# Patient Record
Sex: Female | Born: 1965 | Race: White | Hispanic: No | Marital: Married | Smoking: Never smoker
Health system: Southern US, Community
[De-identification: ages and names within clinical notes are randomized; demographics above are authoritative.]

## PROBLEM LIST (undated history)

## (undated) DIAGNOSIS — E785 Hyperlipidemia, unspecified: Secondary | ICD-10-CM

## (undated) DIAGNOSIS — F419 Anxiety disorder, unspecified: Secondary | ICD-10-CM

## (undated) DIAGNOSIS — T7840XA Allergy, unspecified, initial encounter: Secondary | ICD-10-CM

## (undated) DIAGNOSIS — B977 Papillomavirus as the cause of diseases classified elsewhere: Secondary | ICD-10-CM

## (undated) HISTORY — PX: TONSILLECTOMY: SUR1361

## (undated) HISTORY — DX: Allergy, unspecified, initial encounter: T78.40XA

## (undated) HISTORY — DX: Hyperlipidemia, unspecified: E78.5

## (undated) HISTORY — DX: Papillomavirus as the cause of diseases classified elsewhere: B97.7

## (undated) HISTORY — DX: Anxiety disorder, unspecified: F41.9

---

## 1985-05-29 HISTORY — PX: WISDOM TOOTH EXTRACTION: SHX21

## 1999-01-25 ENCOUNTER — Other Ambulatory Visit: Admission: RE | Admit: 1999-01-25 | Discharge: 1999-01-25 | Payer: Self-pay | Admitting: Family Medicine

## 2000-07-11 ENCOUNTER — Other Ambulatory Visit: Admission: RE | Admit: 2000-07-11 | Discharge: 2000-07-11 | Payer: Self-pay | Admitting: Family Medicine

## 2001-03-19 ENCOUNTER — Encounter: Admission: RE | Admit: 2001-03-19 | Discharge: 2001-03-19 | Payer: Self-pay | Admitting: Family Medicine

## 2001-03-19 ENCOUNTER — Encounter: Payer: Self-pay | Admitting: Family Medicine

## 2001-09-25 ENCOUNTER — Other Ambulatory Visit: Admission: RE | Admit: 2001-09-25 | Discharge: 2001-09-25 | Payer: Self-pay | Admitting: Family Medicine

## 2002-09-29 ENCOUNTER — Other Ambulatory Visit: Admission: RE | Admit: 2002-09-29 | Discharge: 2002-09-29 | Payer: Self-pay | Admitting: Family Medicine

## 2002-11-24 ENCOUNTER — Other Ambulatory Visit: Admission: RE | Admit: 2002-11-24 | Discharge: 2002-11-24 | Payer: Self-pay | Admitting: Family Medicine

## 2003-04-28 ENCOUNTER — Other Ambulatory Visit: Admission: RE | Admit: 2003-04-28 | Discharge: 2003-04-28 | Payer: Self-pay | Admitting: Obstetrics and Gynecology

## 2003-06-25 ENCOUNTER — Other Ambulatory Visit: Admission: RE | Admit: 2003-06-25 | Discharge: 2003-06-25 | Payer: Self-pay | Admitting: Obstetrics and Gynecology

## 2003-12-02 ENCOUNTER — Other Ambulatory Visit: Admission: RE | Admit: 2003-12-02 | Discharge: 2003-12-02 | Payer: Self-pay | Admitting: Obstetrics and Gynecology

## 2004-01-05 ENCOUNTER — Other Ambulatory Visit: Admission: RE | Admit: 2004-01-05 | Discharge: 2004-01-05 | Payer: Self-pay | Admitting: Obstetrics and Gynecology

## 2004-03-29 ENCOUNTER — Other Ambulatory Visit: Admission: RE | Admit: 2004-03-29 | Discharge: 2004-03-29 | Payer: Self-pay | Admitting: Obstetrics and Gynecology

## 2004-09-15 ENCOUNTER — Other Ambulatory Visit: Admission: RE | Admit: 2004-09-15 | Discharge: 2004-09-15 | Payer: Self-pay | Admitting: Obstetrics and Gynecology

## 2005-03-09 ENCOUNTER — Other Ambulatory Visit: Admission: RE | Admit: 2005-03-09 | Discharge: 2005-03-09 | Payer: Self-pay | Admitting: Obstetrics and Gynecology

## 2006-10-29 ENCOUNTER — Ambulatory Visit: Payer: Self-pay | Admitting: Family Medicine

## 2006-10-29 DIAGNOSIS — E7800 Pure hypercholesterolemia, unspecified: Secondary | ICD-10-CM

## 2006-10-31 LAB — CONVERTED CEMR LAB
Cholesterol: 204 mg/dL (ref 0–200)
Direct LDL: 88.7 mg/dL
HDL: 86.2 mg/dL (ref >39.0)
Total CHOL/HDL Ratio: 2.4
Triglycerides: 115 mg/dL (ref 0–149)
VLDL: 23 mg/dL (ref 0–40)

## 2007-09-13 ENCOUNTER — Ambulatory Visit: Payer: Self-pay | Admitting: Family Medicine

## 2008-05-11 ENCOUNTER — Telehealth: Payer: Self-pay | Admitting: Family Medicine

## 2008-06-26 ENCOUNTER — Ambulatory Visit: Payer: Self-pay | Admitting: Family Medicine

## 2008-06-26 DIAGNOSIS — N390 Urinary tract infection, site not specified: Secondary | ICD-10-CM

## 2008-06-26 LAB — CONVERTED CEMR LAB
Bacteria, UA: 0
Bilirubin Urine: NEGATIVE
Glucose, Urine, Semiquant: NEGATIVE
Ketones, urine, test strip: NEGATIVE
Nitrite: NEGATIVE
Protein, U semiquant: NEGATIVE
Specific Gravity, Urine: <1.005
Urobilinogen, UA: 0.2
pH: 6.5

## 2008-06-27 ENCOUNTER — Encounter (INDEPENDENT_AMBULATORY_CARE_PROVIDER_SITE_OTHER): Payer: Self-pay | Admitting: Internal Medicine

## 2008-10-21 ENCOUNTER — Ambulatory Visit: Payer: Self-pay | Admitting: Family Medicine

## 2008-10-21 DIAGNOSIS — M545 Low back pain, unspecified: Secondary | ICD-10-CM | POA: Insufficient documentation

## 2008-10-22 ENCOUNTER — Encounter: Payer: Self-pay | Admitting: Family Medicine

## 2008-10-22 ENCOUNTER — Ambulatory Visit: Payer: Self-pay | Admitting: Family Medicine

## 2008-10-29 ENCOUNTER — Encounter (INDEPENDENT_AMBULATORY_CARE_PROVIDER_SITE_OTHER): Payer: Self-pay | Admitting: *Deleted

## 2009-05-11 ENCOUNTER — Telehealth: Payer: Self-pay | Admitting: Family Medicine

## 2009-08-23 ENCOUNTER — Telehealth: Payer: Self-pay | Admitting: Family Medicine

## 2010-04-17 IMAGING — CR DG LUMBAR SPINE AP/LAT/OBLIQUES W/ FLEX AND EXT
1 series · 5 of 5 positions shown · non-contrast
Comparison: none

REASON FOR EXAM: Back Pain
COMMENTS:

[Series 1: view not recorded · 0.17mm/px · 5 of 5 slices shown]
[im 1/5]
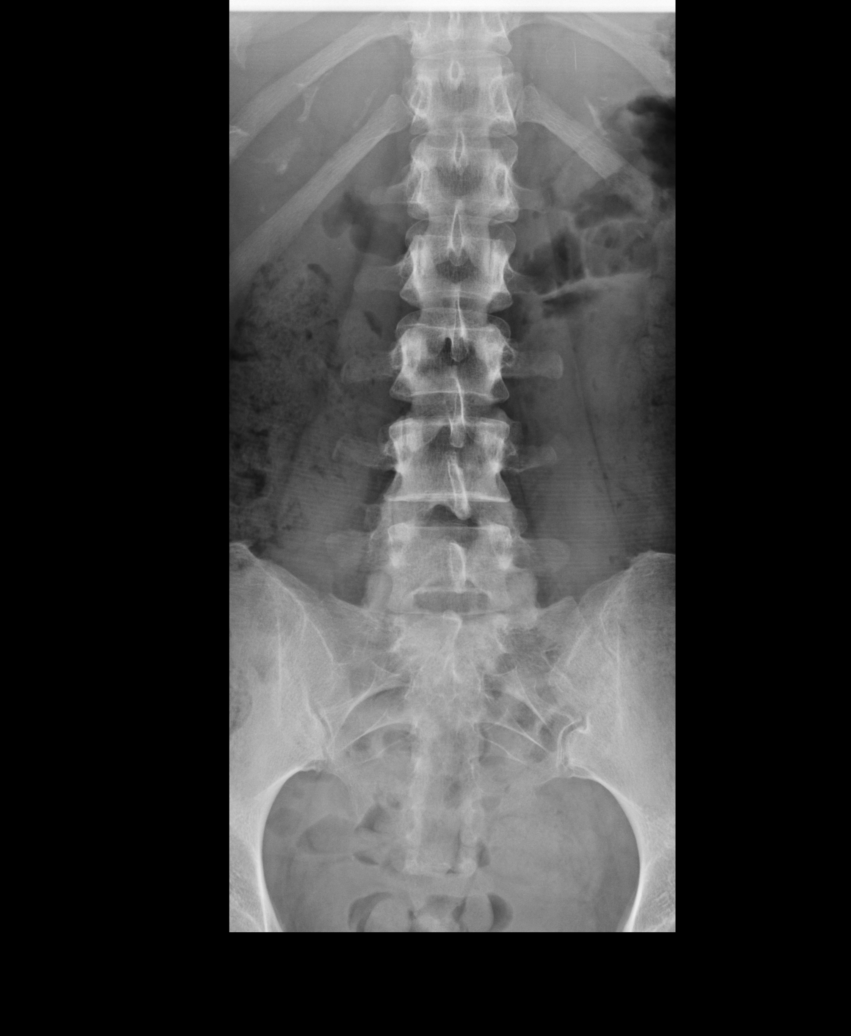
[im 2/5]
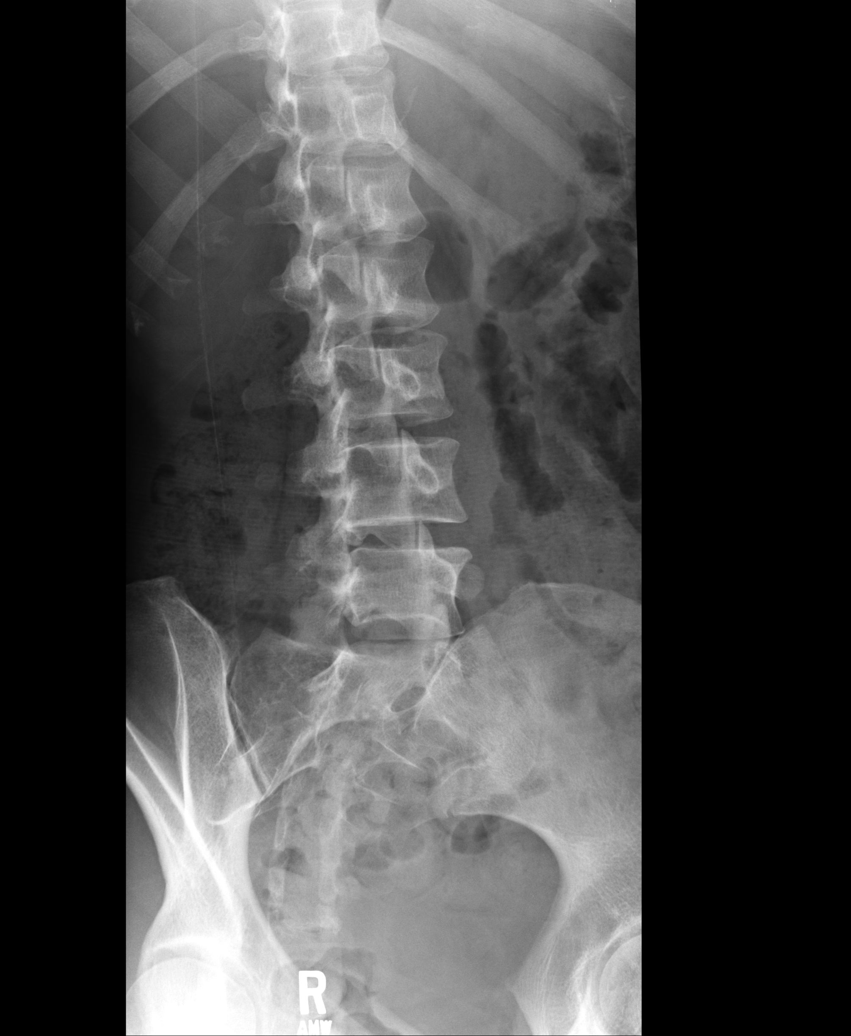
[im 3/5]
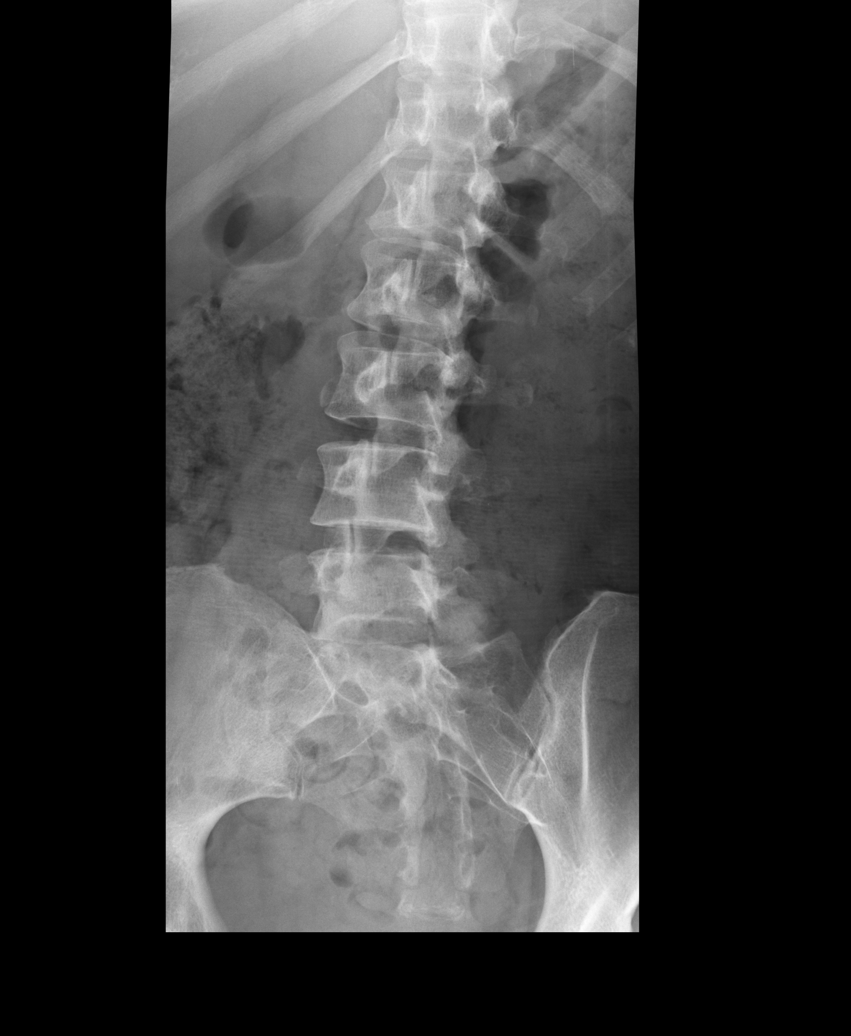
[im 4/5]
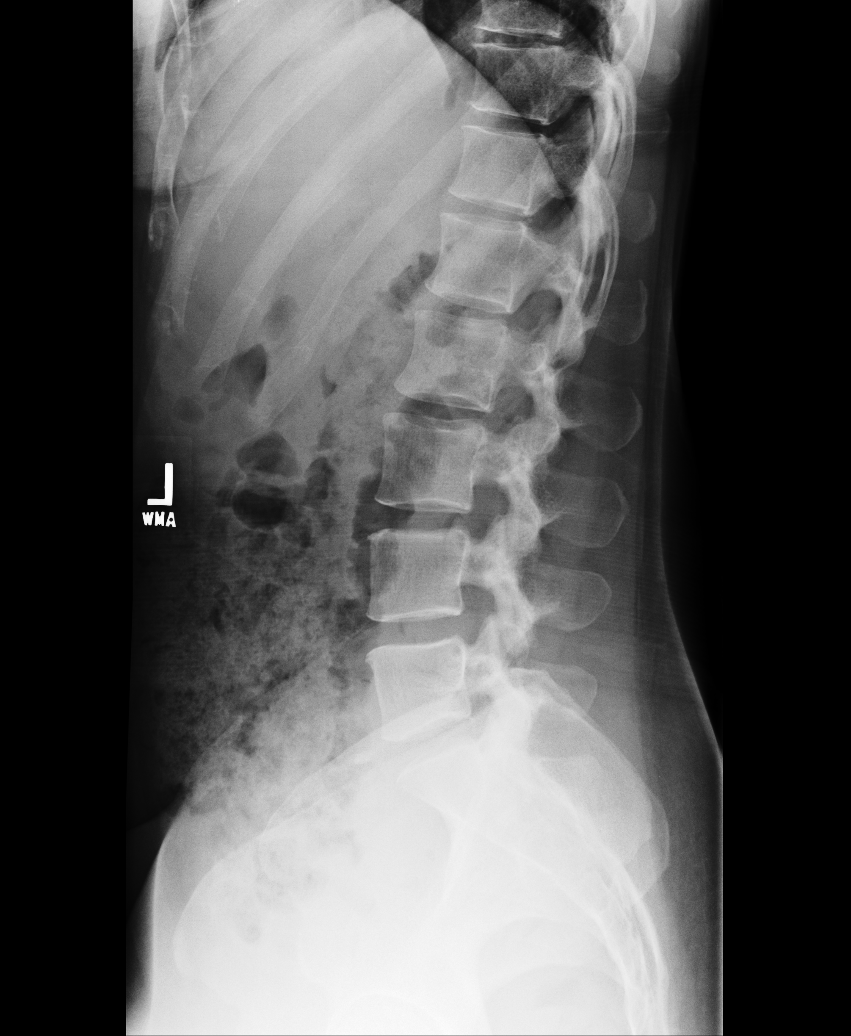
[im 5/5]
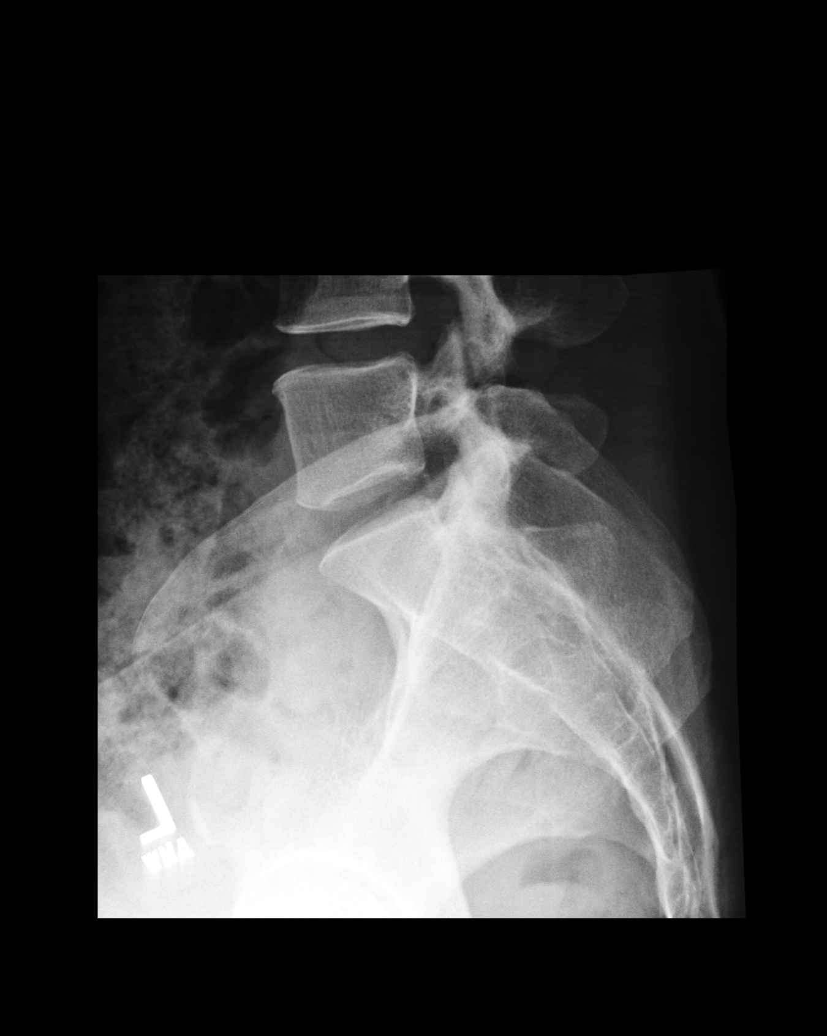

[5 of 5 positions shown; findings below may reference images not displayed]

PROCEDURE:     DXR - DXR LUMBAR SPINE WITH OBLIQUES  - October 22, 2008  [DATE]

RESULT:     The lumbar vertebral bodies are preserved in height. The
pedicles and transverse processes are intact. The observed portions of the
sacrum appear normal. The SI joints exhibit no acute abnormality. I see no
evidence of a pars defect nor of spondylolisthesis. There are calcifications
that project over the collecting systems of both kidneys.
IMPRESSION: I do not see evidence of acute lumbar spine abnormality. I
cannot exclude possible kidneys stones although overlying costal cartilage
calcifications could be responsible for the densities seen on the AP view
projecting over both kidneys. Correlation clinically is needed.

## 2010-05-29 HISTORY — PX: GUM SURGERY: SHX658

## 2010-06-28 NOTE — Progress Notes (Signed)
Summary: Ibuprofen 800mg  refill  Phone Note Refill Request Call back at [phone] Message from:  [facility] [address] on August 23, 2009 10:55 AM  Refills Requested: Medication #1:  IBUPROFEN 800 MG TABS 1 by mouth up to three times a day with food as needed back pain   Last Refilled: 07/01/2003 [facility] [address] request refill on Ibuprofen 800mg . Please advise.   Method Requested: Telephone to Pharmacy Initial call taken by: Lewanda Rife LPN,  August 23, 2009 10:56 AM  Follow-up for Phone Call        px written on EMR for call in  Follow-up by: Judith Part MD,  August 23, 2009 11:15 AM  Additional Follow-up for Phone Call Additional follow up Details #1::        Medication phoned to [facility] [address] pharmacy as instructed. Lewanda Rife LPN  August 23, 2009 2:09 PM     New/Updated Medications: IBUPROFEN 800 MG TABS (IBUPROFEN) 1 by mouth up to three times a day with food as needed back pain Prescriptions: IBUPROFEN 800 MG TABS (IBUPROFEN) 1 by mouth up to three times a day with food as needed back pain  #60 x 5   Entered and Authorized by:   Judith Part MD   Signed by:   Lewanda Rife LPN on 72/53/6644   Method used:   Telephoned to ...       [facility]  [address] (retail)       [address]       [place], [place]  [address]       Ph: [phone] or [phone]       Fax: [phone]   RxID:   [id number]

## 2010-10-25 ENCOUNTER — Encounter: Payer: Self-pay | Admitting: Family Medicine

## 2010-10-25 ENCOUNTER — Telehealth: Payer: Self-pay | Admitting: *Deleted

## 2010-10-25 NOTE — Telephone Encounter (Signed)
Pt called this morning complaining of abd pain since Sunday, it's worse today.  She says it hurts to walk. Per Dr. Milinda Antis advised her to go to ER or [facility], since she will probably needs some type of scan.  Pt agreed.

## 2010-10-26 ENCOUNTER — Ambulatory Visit (INDEPENDENT_AMBULATORY_CARE_PROVIDER_SITE_OTHER): Payer: BC Managed Care – PPO | Admitting: Internal Medicine

## 2010-10-26 ENCOUNTER — Encounter: Payer: Self-pay | Admitting: Internal Medicine

## 2010-10-26 ENCOUNTER — Telehealth: Payer: Self-pay | Admitting: *Deleted

## 2010-10-26 ENCOUNTER — Encounter: Payer: Self-pay | Admitting: *Deleted

## 2010-10-26 VITALS — BP 108/60 | HR 82 | Temp 98.5°F | Ht 66.0 in | Wt 130.0 lb

## 2010-10-26 DIAGNOSIS — R109 Unspecified abdominal pain: Secondary | ICD-10-CM

## 2010-10-26 DIAGNOSIS — R1013 Epigastric pain: Secondary | ICD-10-CM

## 2010-10-26 LAB — CBC WITH DIFFERENTIAL/PLATELET
Basophils Absolute: 0 10*3/uL (ref 0.0–0.1)
Eosinophils Absolute: 0 10*3/uL (ref 0.0–0.7)
HCT: 43 % (ref 36.0–46.0)
Hemoglobin: 14.5 g/dL (ref 12.0–15.0)
Lymphs Abs: 2.1 10*3/uL (ref 0.7–4.0)
MCHC: 33.8 g/dL (ref 30.0–36.0)
MCV: 89.9 fl (ref 78.0–100.0)
Neutro Abs: 9.4 10*3/uL — ABNORMAL HIGH (ref 1.4–7.7)
RDW: 13.7 % (ref 11.5–14.6)

## 2010-10-26 LAB — BASIC METABOLIC PANEL
CO2: 27 mEq/L (ref 19–32)
Calcium: 9.8 mg/dL (ref 8.4–10.5)
GFR: 72.9 mL/min (ref >60.00)
Sodium: 139 mEq/L (ref 135–145)

## 2010-10-26 LAB — POCT URINALYSIS DIPSTICK
Glucose, UA: NEGATIVE
Nitrite, UA: NEGATIVE
Spec Grav, UA: 1
Urobilinogen, UA: 0.2

## 2010-10-26 LAB — SEDIMENTATION RATE: Sed Rate: 37 mm/hr — ABNORMAL HIGH (ref 0–22)

## 2010-10-26 LAB — HEPATIC FUNCTION PANEL
Alkaline Phosphatase: 68 U/L (ref 39–117)
Bilirubin, Direct: 0.2 mg/dL (ref 0.0–0.3)
Total Bilirubin: 0.6 mg/dL (ref 0.3–1.2)

## 2010-10-26 NOTE — Telephone Encounter (Signed)
Pt called with name of medicine that she couldn't remember this morning- it's diethylpropion ER 75 mg's, diet pill- otherwise known as tenuate.

## 2010-10-26 NOTE — Progress Notes (Signed)
Subjective:    Patient ID: Kaitlin Fuller, female    DOB: 03/27/1966, 45 y.o.   MRN: [id number]  HPI 3 nights ago, had very hot chili Stomach felt like it was burning all that night The next day had hot/cold sensation in stomach Appetite was off--since then  Yesterday, awoke with terrible abdominal pain Worse with any movement Had tender abdomen with guarding per physician husband (who is here)  Has tried zantac bid since this started Seems to "take some of the edge off"  No dysuria, frequency or urgency No sig back pain  Lots of stress  Now chair of business at [employer] Has upcoming gum surgery--saw video of this and was grossed out  Has been on a diet med for 5 months Not sure of name  Current outpatient prescriptions:fish oil-omega-3 fatty acids 1000 MG capsule, Take 1 g by mouth 3 (three) times daily.  , Disp: , Rfl: ;  ibuprofen (ADVIL,MOTRIN) 800 MG tablet, Take 800 mg by mouth 3 (three) times daily as needed.  , Disp: , Rfl: ;  Multiple Vitamins-Minerals (COMPLETE WOMENS PO), Take 1 tablet by mouth daily.  , Disp: , Rfl: ;  ranitidine (ZANTAC) 150 MG capsule, Take 150 mg by mouth 2 (two) times daily.  , Disp: , Rfl:  DISCONTD: aspirin 81 MG tablet, Take 81 mg by mouth daily.  , Disp: , Rfl: ;  DISCONTD: cyclobenzaprine (FLEXERIL) 10 MG tablet, Take 5-10 mg by mouth 3 (three) times daily as needed.  , Disp: , Rfl: ;  DISCONTD: drospirenone-ethinyl estradiol (YAZ) 3-0.02 MG per tablet, Take 1 tablet by mouth daily.  , Disp: , Rfl: ;  DISCONTD: scopolamine (TRANSDERM-SCOP) 1.5 MG, Place 1 patch onto the skin every 3 (three) days.  , Disp: , Rfl:   No past medical history on file.  No past surgical history on file.  No family history on file.  History   Social History  . Marital Status: Married    Spouse Name: N/A    Number of Children: N/A  . Years of Education: N/A   Occupational History  . Not on file.   Social History Main Topics  . Smoking status: Never Smoker   .  Smokeless tobacco: Not on file  . Alcohol Use:   . Drug Use:   . Sexually Active:    Other Topics Concern  . Not on file   Social History Narrative   Husband is a Administrator, arts, 2nd marriageExercises regular including yoga    Review of Systems No history of gastritis or ulcer Rarely drinks alcohol--glass of wine last a week ago Uses ibuprofen 800mg  with period--finished last week No dyspareunia    Objective:   Physical Exam  Constitutional: She appears well-developed and well-nourished. No distress.  Neck: Normal range of motion. Neck supple. No thyromegaly present.  Cardiovascular: Normal rate, regular rhythm, normal heart sounds and intact distal pulses.  Exam reveals no gallop.   No murmur heard. Pulmonary/Chest: Effort normal and breath sounds normal. No respiratory distress. She exhibits no tenderness.  Abdominal: Soft. Bowel sounds are normal. She exhibits no mass. There is tenderness. There is no rebound and no guarding.       Epigastric and left side tenderness No RUQ tenderness Murphy's sign absent  Musculoskeletal:       No CVA tenderness  Lymphadenopathy:    She has no cervical adenopathy.  Psychiatric: She has a normal mood and affect. Her behavior is normal. Judgment and thought content normal.  Assessment & Plan:

## 2010-10-26 NOTE — Telephone Encounter (Signed)
No listing of pancreatitis with tenuate but can cause abdominal pain Will await labs

## 2010-10-26 NOTE — Telephone Encounter (Signed)
Message copied by Sueanne Margarita on Wed Oct 26, 2010  5:49 PM ------      Message from: Tillman Abide I      Created: Wed Oct 26, 2010  4:03 PM       Please call      Blood work was not very revealing      White blood count is slightly elevated, as is the sed rate---a non specific indicator of inflammation      The pancreas tests and Helicobactor pylori test (the ulcer bacteria) were both normal      No anemia on blood count      Blood sugar, liver and kidney were all normal            Please continue the zantac. If the pain doesn't get better, the next step would be to check an ultrasound

## 2010-11-22 ENCOUNTER — Other Ambulatory Visit: Payer: Self-pay | Admitting: Family Medicine

## 2010-11-23 NOTE — Telephone Encounter (Signed)
Okay to refill #100 x 0 Please make sure she knows that this can cause stomach pain She should only take it on a full stomach, and stop it if she notes abd pain after taking

## 2010-11-23 NOTE — Telephone Encounter (Signed)
rx sent to pharmacy by e-script  

## 2011-04-05 ENCOUNTER — Encounter: Payer: Self-pay | Admitting: Family Medicine

## 2011-04-05 ENCOUNTER — Ambulatory Visit (INDEPENDENT_AMBULATORY_CARE_PROVIDER_SITE_OTHER): Payer: BC Managed Care – PPO | Admitting: Family Medicine

## 2011-04-05 VITALS — BP 110/62 | HR 69 | Temp 98.8°F | Wt 134.5 lb

## 2011-04-05 DIAGNOSIS — R21 Rash and other nonspecific skin eruption: Secondary | ICD-10-CM | POA: Insufficient documentation

## 2011-04-05 MED ORDER — METRONIDAZOLE 0.75 % EX GEL: Freq: Two times a day (BID) | CUTANEOUS | Status: DC

## 2011-04-05 MED ORDER — TRIAMCINOLONE ACETONIDE 0.025 % EX OINT: TOPICAL_OINTMENT | Freq: Two times a day (BID) | CUTANEOUS | Status: DC

## 2011-04-05 NOTE — Progress Notes (Addendum)
  Subjective:    Patient ID: Kaitlin Fuller, female    DOB: January 14, 1966, 45 y.o.   MRN: [id number]  HPI  45 yo here for right eye lid lesion.  Noticed it over 4 weeks ago- first was itchy and irritated. Then pustular lesions developed and drained thick fluid. Since then, pustules have resolved but still irritated and itchy. Abx ointment not helping. No conjunctival injection, blurred vision, or photophobia.  Never had anything like this before.  No h/o eczema or seasonal allergies.  Patient Active Problem List  Diagnoses  . HYPERCHOLESTEROLEMIA, PURE  . UTI  . BACK PAIN, LUMBAR   No past medical history on file. No past surgical history on file. History  Substance Use Topics  . Smoking status: Never Smoker   . Smokeless tobacco: Not on file  . Alcohol Use:    No family history on file. No Known Allergies Current Outpatient Prescriptions on File Prior to Visit  Medication Sig Dispense Refill  . ibuprofen (ADVIL,MOTRIN) 800 MG tablet TAKE 1 TABLET BY MOUTH UP TO 3 TIMES A DAY WITH FOOD AS NEEDED FOR BACKPAIN  100 tablet  0  . Multiple Vitamins-Minerals (COMPLETE WOMENS PO) Take 1 tablet by mouth daily.        . ranitidine (ZANTAC) 150 MG capsule Take 150 mg by mouth 2 (two) times daily.        . fish oil-omega-3 fatty acids 1000 MG capsule Take 1 g by mouth 3 (three) times daily.         The PMH, PSH, Social History, Family History, Medications, and allergies have been reviewed in [facility], and have been updated if relevant.   Review of Systems    See HPI Objective:   Physical Exam BP 110/62  Pulse 69  Temp(Src) 98.8 F (37.1 C) (Oral)  Wt 134 lb 8 oz (61.009 kg)  LMP 03/29/2011  General:  Well-developed,well-nourished,in no acute distress; alert,appropriate and cooperative throughout examination Head:  normocephalic and atraumatic.   Eyes:  vision grossly intact, pupils equal, pupils round, and pupils reactive to light.   Right eye lid erythematous, flakly, no drainage  or pustules. Ears:  R ear normal and L ear normal.   Nose:  no external deformity.   Mouth:  good dentition.    Neurologic:  alert & oriented X3 and gait normal.   Skin:  Intact without suspicious lesions or rashesy Psych:  Cognition and judgment appear intact. Alert and cooperative with normal attention span and concentration. No apparent delusions, illusions, hallucinations     Assessment & Plan:   1. Rash and nonspecific skin eruption    New. Appears like eczema although not classic presentation. ? perio oral dermatitis which can be worsened by topical steroids. Will try topical metronidazole first for 1-2 weeks. If no improvement, will try triamcinilone.  If no improvement with either, refer to optho or derm. The patient indicates understanding of these issues and agrees with the plan.

## 2011-04-05 NOTE — Progress Notes (Signed)
Addended by: Dianne Dun on: 04/05/2011 10:44 AM   Modules accepted: Orders

## 2011-04-05 NOTE — Patient Instructions (Signed)
Please use steroid ointiment for no longer than 2 weeks. Be very careful not to get it in the eye itself. If no improvement, please let me know.

## 2011-12-04 ENCOUNTER — Encounter: Payer: Self-pay | Admitting: Family Medicine

## 2011-12-04 ENCOUNTER — Ambulatory Visit (INDEPENDENT_AMBULATORY_CARE_PROVIDER_SITE_OTHER): Payer: BC Managed Care – PPO | Admitting: Family Medicine

## 2011-12-04 ENCOUNTER — Telehealth: Payer: Self-pay

## 2011-12-04 VITALS — BP 116/68 | HR 70 | Temp 98.5°F | Ht 64.0 in | Wt 145.8 lb

## 2011-12-04 DIAGNOSIS — N390 Urinary tract infection, site not specified: Secondary | ICD-10-CM

## 2011-12-04 LAB — POCT URINALYSIS DIPSTICK
Blood, UA: NEGATIVE
Glucose, UA: NEGATIVE
Ketones, UA: NEGATIVE
Spec Grav, UA: <=1.005

## 2011-12-04 MED ORDER — CIPROFLOXACIN HCL 250 MG PO TABS: 250.0000 mg | ORAL_TABLET | Freq: Two times a day (BID) | ORAL | Status: AC

## 2011-12-04 NOTE — Progress Notes (Signed)
Subjective:    Patient ID: Kaitlin Fuller, female    DOB: [birth year], 46 y.o.   MRN: [id number]  HPI uti symptoms started after 5 pm on Friday  Had some left over cipro and pyruidium  Took 1 every 12 hours since fri nt -- 6 doses   Feels much better than she was before  Still not 100% When she has to urinate -- still just a little discomfort / also some bladder pressure  Frequency is better  Orange urine - hard to tell if blood  No new back pain (hurt her back before this)  No fever or n/v   Patient Active Problem List  Diagnosis  . HYPERCHOLESTEROLEMIA, PURE  . UTI  . BACK PAIN, LUMBAR  . Rash and nonspecific skin eruption   No past medical history on file. No past surgical history on file. History  Substance Use Topics  . Smoking status: Never Smoker   . Smokeless tobacco: Not on file  . Alcohol Use: Yes     socially   No family history on file. No Known Allergies Current Outpatient Prescriptions on File Prior to Visit  Medication Sig Dispense Refill  . Diethylpropion HCl CR 75 MG TB24 Take 75 mg by mouth daily.      . fish oil-omega-3 fatty acids 1000 MG capsule Take 1 g by mouth 3 (three) times daily.        Marland Kitchen ibuprofen (ADVIL,MOTRIN) 800 MG tablet TAKE 1 TABLET BY MOUTH UP TO 3 TIMES A DAY WITH FOOD AS NEEDED FOR BACKPAIN  100 tablet  0  . Multiple Vitamins-Minerals (COMPLETE WOMENS PO) Take 1 tablet by mouth daily.        . metroNIDAZOLE (METROGEL) 0.75 % gel Apply topically 2 (two) times daily.  45 g  0  . ranitidine (ZANTAC) 150 MG capsule Take 150 mg by mouth 2 (two) times daily.        Marland Kitchen triamcinolone (KENALOG) 0.025 % ointment Apply topically 2 (two) times daily.  30 g  0        Review of Systems Review of Systems  Constitutional: Negative for fever, appetite change, fatigue and unexpected weight change.  Eyes: Negative for pain and visual disturbance.  Respiratory: Negative for cough and shortness of breath.   Cardiovascular: Negative for cp or  palpitations    Gastrointestinal: Negative for nausea, diarrhea and constipation.  Genitourinary: Negative for frequency/ pos for mild urgency and burning  Neg for hematuria or fever  Skin: Negative for pallor or rash   Neurological: Negative for weakness, light-headedness, numbness and headaches.  Hematological: Negative for adenopathy. Does not bruise/bleed easily.  Psychiatric/Behavioral: Negative for dysphoric mood. The patient is not nervous/anxious.         Objective:   Physical Exam  Constitutional: She appears well-developed and well-nourished. No distress.  HENT:  Head: Normocephalic.  Mouth/Throat: Oropharynx is clear and moist.  Eyes: Conjunctivae and EOM are normal. Pupils are equal, round, and reactive to light. No scleral icterus.  Neck: Normal range of motion. Neck supple. No thyromegaly present.  Cardiovascular: Normal rate and regular rhythm.   Pulmonary/Chest: Effort normal and breath sounds normal.  Abdominal: Soft. Bowel sounds are normal. She exhibits no distension and no mass. There is tenderness. There is no rebound and no guarding.       Very slt suprapubic tenderness  Musculoskeletal: She exhibits no edema.       No cva tenderness   Lymphadenopathy:    She has  no cervical adenopathy.  Neurological: She is alert. She has normal reflexes.  Skin: Skin is warm and dry. No rash noted. No pallor.  Psychiatric: She has a normal mood and affect.          Assessment & Plan:

## 2011-12-04 NOTE — Assessment & Plan Note (Signed)
Uncomplicated- partially tx with cipro Will px another 3 d and update if not imp  Still some nitrates on dip but micro is clear

## 2011-12-04 NOTE — Telephone Encounter (Signed)
Frequent and burning urination started 12/01/11. Pt scheduled appt with Dr Milinda Antis today at 3:30pm(pts request). Pt will call back if condition changes or worsens.

## 2011-12-04 NOTE — Patient Instructions (Addendum)
Stop the pyridium and if symptoms worsen- call Drink lot of water  Avoid sodas and artificial sweetner for now  Update if not starting to improve in a week or if worsening   Take the cipro 250 mg twice daily for another 3 days

## 2011-12-05 LAB — POCT UA - MICROSCOPIC ONLY: WBC, Ur, HPF, POC: 0

## 2012-02-16 ENCOUNTER — Other Ambulatory Visit: Payer: Self-pay | Admitting: *Deleted

## 2012-02-16 MED ORDER — IBUPROFEN 800 MG PO TABS
800.0000 mg | ORAL_TABLET | Freq: Three times a day (TID) | ORAL | Status: DC | PRN
Start: 2012-02-16 — End: 2015-03-11

## 2012-02-16 NOTE — Telephone Encounter (Signed)
Will refill electronically  

## 2012-04-02 ENCOUNTER — Encounter: Payer: Self-pay | Admitting: Family Medicine

## 2012-04-02 ENCOUNTER — Ambulatory Visit (INDEPENDENT_AMBULATORY_CARE_PROVIDER_SITE_OTHER): Payer: BC Managed Care – PPO | Admitting: Family Medicine

## 2012-04-02 VITALS — BP 116/68 | HR 60 | Temp 98.4°F | Ht 64.0 in | Wt 137.2 lb

## 2012-04-02 DIAGNOSIS — N390 Urinary tract infection, site not specified: Secondary | ICD-10-CM

## 2012-04-02 DIAGNOSIS — R300 Dysuria: Secondary | ICD-10-CM

## 2012-04-02 LAB — POCT URINALYSIS DIPSTICK
Bilirubin, UA: NEGATIVE
Nitrite, UA: POSITIVE
pH, UA: 7

## 2012-04-02 MED ORDER — CIPROFLOXACIN HCL 250 MG PO TABS: 250.0000 mg | ORAL_TABLET | Freq: Two times a day (BID) | ORAL | Status: DC

## 2012-04-02 MED ORDER — PHENAZOPYRIDINE HCL 200 MG PO TABS: 200.0000 mg | ORAL_TABLET | Freq: Three times a day (TID) | ORAL | Status: DC | PRN

## 2012-04-02 NOTE — Patient Instructions (Addendum)
Drink lots of water  Take pyridium as needed with food  Take cipro as directed  We will let you know when urine culture returns

## 2012-04-02 NOTE — Progress Notes (Signed)
Subjective:    Patient ID: Kaitlin Fuller, female    DOB: November 29, 1965, 46 y.o.   MRN: [id number]  HPI Here for suspected uti   Saturday after noon felt hot and cold Uncomfortable to urinate - felt like she could not empty her bladder totally  A little vaginal discharge - used 3 day monistat Was better Sunday  Monday- worse again- cramping in bladder / some back pain/ felt incomplete emptying  Azo helped symptoms a lot  This feels a little different then a usual bladder infection  She does get the shivers when she urinates  Saturday- urgency and frequency -- better with azo   Temp is normal right now   Patient Active Problem List  Diagnosis  . HYPERCHOLESTEROLEMIA, PURE  . UTI  . BACK PAIN, LUMBAR  . Rash and nonspecific skin eruption   No past medical history on file. No past surgical history on file. History  Substance Use Topics  . Smoking status: Never Smoker   . Smokeless tobacco: Not on file  . Alcohol Use: Yes     Comment: socially   No family history on file. No Known Allergies Current Outpatient Prescriptions on File Prior to Visit  Medication Sig Dispense Refill  . Diethylpropion HCl CR 75 MG TB24 Take 75 mg by mouth daily.      . fish oil-omega-3 fatty acids 1000 MG capsule Take 1 g by mouth 3 (three) times daily.        Marland Kitchen ibuprofen (ADVIL,MOTRIN) 800 MG tablet Take 1 tablet (800 mg total) by mouth 3 (three) times daily as needed for pain (with food).  90 tablet  0  . Multiple Vitamins-Minerals (COMPLETE WOMENS PO) Take 1 tablet by mouth daily.        . phenazopyridine (PYRIDIUM) 200 MG tablet Take 200 mg by mouth 3 (three) times daily as needed.           Review of Systems Review of Systems  Constitutional: Negative for  appetite change,  and unexpected weight change.  Eyes: Negative for pain and visual disturbance.  Respiratory: Negative for cough and shortness of breath.   Cardiovascular: Negative for cp or palpitations    Gastrointestinal: Negative  for nausea, diarrhea and constipation.  Genitourinary: pos for urgency and frequency.neg for blood in urine   Skin: Negative for pallor or rash   Neurological: Negative for weakness, light-headedness, numbness and headaches.  Hematological: Negative for adenopathy. Does not bruise/bleed easily.  Psychiatric/Behavioral: Negative for dysphoric mood. The patient is not nervous/anxious.         Objective:   Physical Exam  Constitutional: She appears well-developed and well-nourished. No distress.  HENT:  Head: Normocephalic and atraumatic.  Eyes: Conjunctivae normal and EOM are normal. Pupils are equal, round, and reactive to light.  Neck: Normal range of motion. Neck supple. No JVD present. No thyromegaly present.  Cardiovascular: Normal rate and regular rhythm.   Pulmonary/Chest: Effort normal and breath sounds normal.  Abdominal: Soft. Bowel sounds are normal. She exhibits no distension and no mass. There is tenderness. There is no rebound and no guarding.       Mild suprapubic tenderness  Musculoskeletal:       No cva tenderness   Lymphadenopathy:    She has no cervical adenopathy.  Neurological: She is alert. She has normal reflexes.  Skin: Skin is warm and dry. No rash noted.  Psychiatric: She has a normal mood and affect.  Assessment & Plan:

## 2012-04-02 NOTE — Assessment & Plan Note (Signed)
Uncomplicated  tx with cipro and pyridium for pain prn  Push fluids Urine sent for cx Adv to update if not improving

## 2012-04-04 LAB — POCT UA - MICROSCOPIC ONLY

## 2012-04-04 LAB — URINE CULTURE

## 2012-05-16 ENCOUNTER — Other Ambulatory Visit: Payer: Self-pay | Admitting: Obstetrics and Gynecology

## 2013-06-25 ENCOUNTER — Encounter: Payer: Self-pay | Admitting: Family Medicine

## 2013-06-25 ENCOUNTER — Ambulatory Visit (INDEPENDENT_AMBULATORY_CARE_PROVIDER_SITE_OTHER)
Admission: RE | Admit: 2013-06-25 | Discharge: 2013-06-25 | Disposition: A | Discharge status: Home / Self Care | Payer: BC Managed Care – PPO | Source: Ambulatory Visit | Attending: Family Medicine | Admitting: Family Medicine

## 2013-06-25 ENCOUNTER — Ambulatory Visit (INDEPENDENT_AMBULATORY_CARE_PROVIDER_SITE_OTHER): Payer: BC Managed Care – PPO | Admitting: Family Medicine

## 2013-06-25 VITALS — BP 100/70 | HR 67 | Temp 98.4°F | Ht 63.5 in | Wt 149.5 lb

## 2013-06-25 DIAGNOSIS — M79609 Pain in unspecified limb: Secondary | ICD-10-CM

## 2013-06-25 DIAGNOSIS — M79671 Pain in right foot: Secondary | ICD-10-CM

## 2013-06-25 NOTE — Progress Notes (Signed)
   Subjective:    Patient ID: Kaitlin Fuller, female    DOB: 10-Dec-1965, 48 y.o.   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 BACK PAIN, LUMBAR 10/21/2008  . HYPERCHOLESTEROLEMIA, PURE 10/29/2006   No past medical history on file. No past surgical history on file. History  Substance Use Topics  . Smoking status: Never Smoker   . Smokeless tobacco: Never Used  . Alcohol Use: Yes     Comment: socially   No family history on file. No Known Allergies Current Outpatient Prescriptions on File Prior to Visit  Medication Sig Dispense Refill  . fish oil-omega-3 fatty acids 1000 MG capsule Take 1 g by mouth 3 (three) times daily.        Marland Kitchen ibuprofen (ADVIL,MOTRIN) 800 MG tablet Take 1 tablet (800 mg total) by mouth 3 (three) times daily as needed for pain (with food).  90 tablet  0  . Multiple Vitamins-Minerals (COMPLETE WOMENS PO) Take 1 tablet by mouth daily.         No current facility-administered medications on file prior to visit.      Review of Systems Review of Systems  Constitutional: Negative for fever, appetite change, fatigue and unexpected weight change.  Eyes: Negative for pain and visual disturbance.  Respiratory: Negative for cough and shortness of breath.   Cardiovascular: Negative for cp or palpitations    Gastrointestinal: Negative for nausea, diarrhea and constipation.  Genitourinary: Negative for  urgency and frequency.  Skin: Negative for pallor or rash   MSK pos for knee pain and hip pain/ neg for joint swelling  Neurological: Negative for weakness, light-headedness, numbness and headaches.  Hematological: Negative for adenopathy. Does not bruise/bleed easily.  Psychiatric/Behavioral: Negative for dysphoric mood. The patient is not nervous/anxious.         Objective:   Physical Exam  Constitutional: She appears well-developed and well-nourished. No distress.  HENT:  Head: Normocephalic and atraumatic.  Neck: Normal range of motion. Neck supple.  Cardiovascular: Normal rate and regular rhythm.   Musculoskeletal: She exhibits tenderness. She exhibits no edema.  Tender over greater trochanter on R  Nl rom hip and leg No LS tenderness  R foot- tender over mid 3rd and 4th metatarsals  No swelling or ecchymosis  Nl rom foot  Nl perfusion and slt altered sensation to lt touch over the tender area   Neurological: She is alert. She has normal reflexes. She displays no atrophy. She exhibits normal muscle tone. Gait normal.  slt dec sens to lt touch over dorsal foot in area of tenderness    Skin: Skin is warm and dry. No rash noted.  Psychiatric: She has a normal mood and affect.          Assessment & Plan:

## 2013-06-25 NOTE — Progress Notes (Signed)
Pre-visit discussion using our clinic review tool. No additional management support is needed unless otherwise documented below in the visit note.  

## 2013-06-25 NOTE — Patient Instructions (Signed)
Foot xray today- I want to rule out a stress fracture  A neuroma is also possible ( a growth on a nerve) Continue naproxen with food as needed  If xray is normal I will refer you to a podiatrist for further evaluation

## 2013-06-26 ENCOUNTER — Telehealth: Payer: Self-pay | Admitting: Family Medicine

## 2013-06-26 DIAGNOSIS — M79671 Pain in right foot: Secondary | ICD-10-CM

## 2013-06-26 NOTE — Telephone Encounter (Signed)
Message copied by Abner Greenspan on Thu Jun 26, 2013  1:20 PM ------      Message from: Tammi Sou      Created: Thu Jun 26, 2013 10:50 AM       Pt notified of xray results and she does agree with referral to podiatry, I advise pt Marion/Linda will call to schedule appt ------

## 2013-06-26 NOTE — Telephone Encounter (Signed)
rf to podiatry

## 2013-06-26 NOTE — Assessment & Plan Note (Signed)
Tender over 3,4th metatarsals xr to eval for poss stress fx Given sensory changes - consider poss of neuroma as well  Will use cold compress and nsaid prn  May ref to podiatry for further eval

## 2013-07-18 ENCOUNTER — Encounter: Payer: Self-pay | Admitting: Internal Medicine

## 2013-07-18 ENCOUNTER — Ambulatory Visit (INDEPENDENT_AMBULATORY_CARE_PROVIDER_SITE_OTHER): Payer: BC Managed Care – PPO | Admitting: Internal Medicine

## 2013-07-18 VITALS — BP 110/66 | HR 82 | Temp 98.6°F | Wt 146.2 lb

## 2013-07-18 DIAGNOSIS — M545 Low back pain, unspecified: Secondary | ICD-10-CM

## 2013-07-18 DIAGNOSIS — R3915 Urgency of urination: Secondary | ICD-10-CM

## 2013-07-18 DIAGNOSIS — N390 Urinary tract infection, site not specified: Secondary | ICD-10-CM

## 2013-07-18 LAB — POCT URINALYSIS DIPSTICK
Bilirubin, UA: NEGATIVE
GLUCOSE UA: NEGATIVE
Ketones, UA: 5
Nitrite, UA: POSITIVE
PH UA: 7
Spec Grav, UA: 1.015
UROBILINOGEN UA: 0.2

## 2013-07-18 MED ORDER — CIPROFLOXACIN HCL 500 MG PO TABS: 500.0000 mg | ORAL_TABLET | Freq: Two times a day (BID) | ORAL | Status: DC

## 2013-07-18 MED ORDER — PHENAZOPYRIDINE HCL 200 MG PO TABS: 200.0000 mg | ORAL_TABLET | Freq: Three times a day (TID) | ORAL | Status: DC | PRN

## 2013-07-18 NOTE — Patient Instructions (Addendum)
Urinary Tract Infection  Urinary tract infections (UTIs) can develop anywhere along your urinary tract. Your urinary tract is your body's drainage system for removing wastes and extra water. Your urinary tract includes two kidneys, two ureters, a bladder, and a urethra. Your kidneys are a pair of bean-shaped organs. Each kidney is about the size of your fist. They are located below your ribs, one on each side of your spine.  CAUSES  Infections are caused by microbes, which are microscopic organisms, including fungi, viruses, and bacteria. These organisms are so small that they can only be seen through a microscope. Bacteria are the microbes that most commonly cause UTIs.  SYMPTOMS   Symptoms of UTIs may vary by age and gender of the patient and by the location of the infection. Symptoms in young women typically include a frequent and intense urge to urinate and a painful, burning feeling in the bladder or urethra during urination. Older women and men are more likely to be tired, shaky, and weak and have muscle aches and abdominal pain. A fever may mean the infection is in your kidneys. Other symptoms of a kidney infection include pain in your back or sides below the ribs, nausea, and vomiting.  DIAGNOSIS  To diagnose a UTI, your caregiver will ask you about your symptoms. Your caregiver also will ask to provide a urine sample. The urine sample will be tested for bacteria and white blood cells. White blood cells are made by your body to help fight infection.  TREATMENT   Typically, UTIs can be treated with medication. Because most UTIs are caused by a bacterial infection, they usually can be treated with the use of antibiotics. The choice of antibiotic and length of treatment depend on your symptoms and the type of bacteria causing your infection.  HOME CARE INSTRUCTIONS   If you were prescribed antibiotics, take them exactly as your caregiver instructs you. Finish the medication even if you feel better after you  have only taken some of the medication.   Drink enough water and fluids to keep your urine clear or pale yellow.   Avoid caffeine, tea, and carbonated beverages. They tend to irritate your bladder.   Empty your bladder often. Avoid holding urine for long periods of time.   Empty your bladder before and after sexual intercourse.   After a bowel movement, women should cleanse from front to back. Use each tissue only once.  SEEK MEDICAL CARE IF:    You have back pain.   You develop a fever.   Your symptoms do not begin to resolve within 3 days.  SEEK IMMEDIATE MEDICAL CARE IF:    You have severe back pain or lower abdominal pain.   You develop chills.   You have nausea or vomiting.   You have continued burning or discomfort with urination.  MAKE SURE YOU:    Understand these instructions.   Will watch your condition.   Will get help right away if you are not doing well or get worse.  Document Released: 02/22/2005 Document Revised: 11/14/2011 Document Reviewed: 06/23/2011  [employer] Patient Information 2014 [employer].

## 2013-07-18 NOTE — Progress Notes (Signed)
HPI  Pt presents to the clinic today with c/o urgency, frequency, chills, and back pain. She reports this started about 3 days ago. She has been taking left over pyridium which has helped with the urgency. She denies fevers. She has had UTI's in the past and reports this feels the same.   Review of Systems  History reviewed. No pertinent past medical history.  History reviewed. No pertinent family history.  History   Social History  . Marital Status: Married    Spouse Name: N/A    Number of Children: N/A  . Years of Education: N/A   Occupational History  . Not on file.   Social History Main Topics  . Smoking status: Never Smoker   . Smokeless tobacco: Never Used  . Alcohol Use: Yes     Comment: socially  . Drug Use: No  . Sexual Activity: Not on file   Other Topics Concern  . Not on file   Social History Narrative   Husband is a Investment banker, corporate, 2nd marriage   Exercises regular including yoga    No Known Allergies  Constitutional: Denies fever, malaise, fatigue, headache or abrupt weight changes.   GU: Pt reports urgency, frequency and pain with urination. Denies burning sensation, blood in urine, odor or discharge. Skin: Denies redness, rashes, lesions or ulcercations.   No other specific complaints in a complete review of systems (except as listed in HPI above).    Objective:   Physical Exam  BP 110/66  Pulse 82  Temp(Src) 98.6 F (37 C) (Oral)  Wt 146 lb 4 oz (66.339 kg)  SpO2 98%  LMP 06/06/2013 Wt Readings from Last 3 Encounters:  07/18/13 146 lb 4 oz (66.339 kg)  06/25/13 149 lb 8 oz (67.813 kg)  04/02/12 137 lb 4 oz (62.256 kg)    General: Appears her stated age, well developed, well nourished in NAD. Cardiovascular: Normal rate and rhythm. S1,S2 noted.  No murmur, rubs or gallops noted. No JVD or BLE edema. No carotid bruits noted. Pulmonary/Chest: Normal effort and positive vesicular breath sounds. No respiratory distress. No wheezes, rales or ronchi  noted.  Abdomen: Soft and nontender. Normal bowel sounds, no bruits noted. No distention or masses noted. Liver, spleen and kidneys non palpable. Tender to palpation over the bladder area. No CVA tenderness.      Assessment & Plan:   Urgency, Frequency, Dysuria secondary to UTI  Urinalysis: + blood, leuks and nitrited eRx sent if for Cipro 500 mg BID x 5 days eRx for pyridium Drink plenty of fluids  RTC as needed or if symptoms persist.

## 2013-07-18 NOTE — Progress Notes (Signed)
Pre visit review using our clinic review tool, if applicable. No additional management support is needed unless otherwise documented below in the visit note. 

## 2013-07-18 NOTE — Addendum Note (Signed)
Addended by: Lurlean Nanny on: 07/18/2013 04:22 PM   Modules accepted: Orders

## 2013-07-20 LAB — URINE CULTURE

## 2013-07-21 ENCOUNTER — Ambulatory Visit (INDEPENDENT_AMBULATORY_CARE_PROVIDER_SITE_OTHER): Payer: BC Managed Care – PPO

## 2013-07-21 ENCOUNTER — Encounter: Payer: Self-pay | Admitting: Podiatry

## 2013-07-21 ENCOUNTER — Ambulatory Visit (INDEPENDENT_AMBULATORY_CARE_PROVIDER_SITE_OTHER): Payer: BC Managed Care – PPO | Admitting: Podiatry

## 2013-07-21 VITALS — BP 119/80 | HR 70 | Resp 12

## 2013-07-21 DIAGNOSIS — R52 Pain, unspecified: Secondary | ICD-10-CM

## 2013-07-21 DIAGNOSIS — S93609A Unspecified sprain of unspecified foot, initial encounter: Secondary | ICD-10-CM

## 2013-07-21 NOTE — Progress Notes (Signed)
   Subjective:    Patient ID: Kaitlin Fuller, female    DOB: [birth year], 48 y.o.   MRN: [id number]  HPI '' RT TOP OF THE FOOT HAVE NUMBNESS FEELING AND FEELS LIKE IS SWOLLEN THE END OF THE DAY. THIS BOTHERING FOR  2 MONTHS TRY TO KEEP ELEVATED AS MUCH, BUT IS MUCH BETTER.'' The right foot is painful with standing and walking and is relieved with rest. She has has discontinued her walking program because of the persistent right foot pain. When she resumes her walking program the right foot pain recurs. She has had an x-ray taken on 06/25/2013 without a significant findings noted.   Review of Systems  All other systems reviewed and are negative.       Objective:   Physical Exam  48 year old white female appears orientated x3 presents with her husband  Vascular: The DP and PT pulses are 2/4 bilaterally.  Neurological: Knee and ankle reflexes are trace reactive bilaterally  Dermatological: Texture and turgor within normal limits  Musculoskeletal: There is no restriction ankle, subtalar, midtarsal joints bilaterally. There is palpable tenderness in the dorsal right midfoot without a palpable lesions.  Manual muscle testing lower extremity: Hip abduction an adduction 5/5 bilaterally Knee flexion extension 5/5 bilaterally Foot dorsi flexion and plantar flexion 5/5 bilaterally Foot inversion and eversion 5/5 bilaterally   X-ray report weightbearing right foot  Intact bony structure without any fractures and or dislocations noted. On the lateral view there is a dorsal spur noted on the navicular area. Taylor's bunion is noted.  Radiographic impression: No acute bony abnormality noted     Assessment & Plan:   Assessment: Rule out midfoot stress fracture right foot Sprain right foot/capsulitis  Plan: Patient placed in surgical shoe for immobilization the right foot Refer patient for noncontrast MRI of the right foot for the indication of persistent midfoot pain without any  improvement over time.  Schedule patient for MRI image and return patient on receipt of MRI report.

## 2013-07-21 NOTE — Patient Instructions (Signed)
Wear the surgical shoe on the right foot when walking and standing. Our office will schedule an MRI .

## 2013-07-22 ENCOUNTER — Other Ambulatory Visit: Payer: Self-pay | Admitting: *Deleted

## 2013-07-22 DIAGNOSIS — M79606 Pain in leg, unspecified: Secondary | ICD-10-CM

## 2013-07-23 ENCOUNTER — Encounter: Payer: Self-pay | Admitting: Podiatry

## 2013-07-23 ENCOUNTER — Telehealth: Payer: Self-pay | Admitting: *Deleted

## 2013-07-23 NOTE — Telephone Encounter (Signed)
Pt informed the MRI was approved and the [place] prior authorization# [id number] was faxed to [employer].

## 2013-07-29 ENCOUNTER — Other Ambulatory Visit: Payer: BC Managed Care – PPO

## 2014-09-01 ENCOUNTER — Other Ambulatory Visit: Payer: Self-pay | Admitting: Obstetrics and Gynecology

## 2014-09-02 LAB — CYTOLOGY - PAP

## 2014-09-11 ENCOUNTER — Encounter: Payer: Self-pay | Admitting: Primary Care

## 2014-09-11 ENCOUNTER — Ambulatory Visit (INDEPENDENT_AMBULATORY_CARE_PROVIDER_SITE_OTHER): Payer: BC Managed Care – PPO | Admitting: Primary Care

## 2014-09-11 VITALS — BP 110/70 | HR 96 | Temp 98.7°F | Ht 63.5 in | Wt 142.4 lb

## 2014-09-11 DIAGNOSIS — N390 Urinary tract infection, site not specified: Secondary | ICD-10-CM | POA: Diagnosis not present

## 2014-09-11 DIAGNOSIS — R319 Hematuria, unspecified: Secondary | ICD-10-CM | POA: Diagnosis not present

## 2014-09-11 LAB — POCT URINALYSIS DIPSTICK
Bilirubin, UA: NEGATIVE
GLUCOSE UA: NEGATIVE
Ketones, UA: NEGATIVE
NITRITE UA: POSITIVE
PH UA: 6
Spec Grav, UA: 1.02
Urobilinogen, UA: 4

## 2014-09-11 MED ORDER — PHENAZOPYRIDINE HCL 200 MG PO TABS: 200.0000 mg | ORAL_TABLET | Freq: Three times a day (TID) | ORAL | Status: DC | PRN

## 2014-09-11 MED ORDER — CIPROFLOXACIN HCL 500 MG PO TABS: 500.0000 mg | ORAL_TABLET | Freq: Two times a day (BID) | ORAL | Status: DC

## 2014-09-11 NOTE — Progress Notes (Signed)
Subjective:    Patient ID: Kaitlin Fuller, female    DOB: Jun 25, 1965, 49 y.o.   MRN: [id number]  HPI  Kaitlin Fuller is a 49 year old female who presents today with a chief complaint of bilateral flank pain that began Wednesday. She also reports associated symptoms of urinary urgency and dysuria. She experienced chills and moderate discomfort last night so she took one pyridium pill with some relief. She has tried drinking water and cranberry juice to help flush out any bacteria. Denies nausea, vomiting, fevers, history of kidney stones.   Review of Systems  Constitutional: Positive for chills. Negative for fever.  Respiratory: Negative for shortness of breath.   Cardiovascular: Negative for chest pain.  Gastrointestinal: Positive for nausea. Negative for vomiting, abdominal pain, diarrhea and constipation.  Genitourinary: Positive for dysuria, urgency, frequency and flank pain. Negative for hematuria.  Musculoskeletal: Negative for myalgias.       No past medical history on file.  History   Social History  . Marital Status: Married    Spouse Name: N/A  . Number of Children: N/A  . Years of Education: N/A   Occupational History  . Not on file.   Social History Main Topics  . Smoking status: Never Smoker   . Smokeless tobacco: Never Used  . Alcohol Use: 0.0 oz/week    0 Standard drinks or equivalent per week     Comment: socially  . Drug Use: No  . Sexual Activity: Not on file   Other Topics Concern  . Not on file   Social History Narrative   Husband is a Investment banker, corporate, 2nd marriage   Exercises regular including yoga    No past surgical history on file.  No family history on file.  No Known Allergies  Current Outpatient Prescriptions on File Prior to Visit  Medication Sig Dispense Refill  . fish oil-omega-3 fatty acids 1000 MG capsule Take 1 g by mouth 3 (three) times daily.      . Multiple Vitamins-Minerals (COMPLETE WOMENS PO) Take 1 tablet by mouth daily.      .  naproxen (NAPROSYN) 500 MG tablet Take 500 mg by mouth as needed.    Marland Kitchen ibuprofen (ADVIL,MOTRIN) 800 MG tablet Take 1 tablet (800 mg total) by mouth 3 (three) times daily as needed for pain (with food). (Patient not taking: Reported on 09/11/2014) 90 tablet 0  . phentermine 37.5 MG capsule Take 37.5 mg by mouth every morning.     No current facility-administered medications on file prior to visit.    BP 110/70 mmHg  Pulse 96  Temp(Src) 98.7 F (37.1 C) (Oral)  Ht 5' 3.5" (1.613 m)  Wt 142 lb 6.4 oz (64.592 kg)  BMI 24.83 kg/m2  SpO2 98%  LMP 08/11/2014     Objective:   Physical Exam  Constitutional: She is oriented to person, place, and time. She appears well-developed.  Neck: Neck supple.  Cardiovascular: Normal rate and regular rhythm.   Pulmonary/Chest: Effort normal and breath sounds normal.  Abdominal: Soft. There is no hepatomegaly. There is no tenderness. There is CVA tenderness.  Lymphadenopathy:    She has no cervical adenopathy.  Neurological: She is alert and oriented to person, place, and time.  Skin: Skin is warm and dry.  Psychiatric: She has a normal mood and affect.          Assessment & Plan:  UA positive for leuks, blood, nitrites.  Due to flank pain and symptoms will treat to cover  for possible pyelonephritis and UTI. Cipro 500mg  BID for 7 days. New RX for pyridium provided for pain/symptoms.  She is to notify me if no improvement in the next 3-4 days or if she develops fever, chills, worsening symptoms.

## 2014-09-11 NOTE — Progress Notes (Signed)
Pre visit review using our clinic review tool, if applicable. No additional management support is needed unless otherwise documented below in the visit note. 

## 2014-09-11 NOTE — Patient Instructions (Signed)
Your urine resulted in a urinary tract infection. Please start taking Ciprofloxacin, which is an antibiotic to help irridicate the infection. Take one tablet twice daily for 7 days. You may take the Pyridium three times daily as needed for pain. Please let me know if you are not feeling better in the next 3-4 days. Take care!  Urinary Tract Infection Urinary tract infections (UTIs) can develop anywhere along your urinary tract. Your urinary tract is your body's drainage system for removing wastes and extra water. Your urinary tract includes two kidneys, two ureters, a bladder, and a urethra. Your kidneys are a pair of bean-shaped organs. Each kidney is about the size of your fist. They are located below your ribs, one on each side of your spine. CAUSES Infections are caused by microbes, which are microscopic organisms, including fungi, viruses, and bacteria. These organisms are so small that they can only be seen through a microscope. Bacteria are the microbes that most commonly cause UTIs. SYMPTOMS  Symptoms of UTIs may vary by age and gender of the patient and by the location of the infection. Symptoms in young women typically include a frequent and intense urge to urinate and a painful, burning feeling in the bladder or urethra during urination. Older women and men are more likely to be tired, shaky, and weak and have muscle aches and abdominal pain. A fever may mean the infection is in your kidneys. Other symptoms of a kidney infection include pain in your back or sides below the ribs, nausea, and vomiting. DIAGNOSIS To diagnose a UTI, your caregiver will ask you about your symptoms. Your caregiver also will ask to provide a urine sample. The urine sample will be tested for bacteria and white blood cells. White blood cells are made by your body to help fight infection. TREATMENT  Typically, UTIs can be treated with medication. Because most UTIs are caused by a bacterial infection, they usually can  be treated with the use of antibiotics. The choice of antibiotic and length of treatment depend on your symptoms and the type of bacteria causing your infection. HOME CARE INSTRUCTIONS  If you were prescribed antibiotics, take them exactly as your caregiver instructs you. Finish the medication even if you feel better after you have only taken some of the medication.  Drink enough water and fluids to keep your urine clear or pale yellow.  Avoid caffeine, tea, and carbonated beverages. They tend to irritate your bladder.  Empty your bladder often. Avoid holding urine for long periods of time.  Empty your bladder before and after sexual intercourse.  After a bowel movement, women should cleanse from front to back. Use each tissue only once. SEEK MEDICAL CARE IF:   You have back pain.  You develop a fever.  Your symptoms do not begin to resolve within 3 days. SEEK IMMEDIATE MEDICAL CARE IF:   You have severe back pain or lower abdominal pain.  You develop chills.  You have nausea or vomiting.  You have continued burning or discomfort with urination. MAKE SURE YOU:   Understand these instructions.  Will watch your condition.  Will get help right away if you are not doing well or get worse. Document Released: 02/22/2005 Document Revised: 11/14/2011 Document Reviewed: 06/23/2011 [facility] Patient Information 2015 [place], [place]. This information is not intended to replace advice given to you by your health care provider. Make sure you discuss any questions you have with your health care provider.

## 2014-12-19 IMAGING — CR DG FOOT COMPLETE 3+V*R*
3 series · 3 of 3 positions shown · non-contrast
Comparison: None.

CLINICAL DATA: Right foot pain

EXAM:
RIGHT FOOT COMPLETE - 3+ VIEW

[view not recorded (1 of 3)]
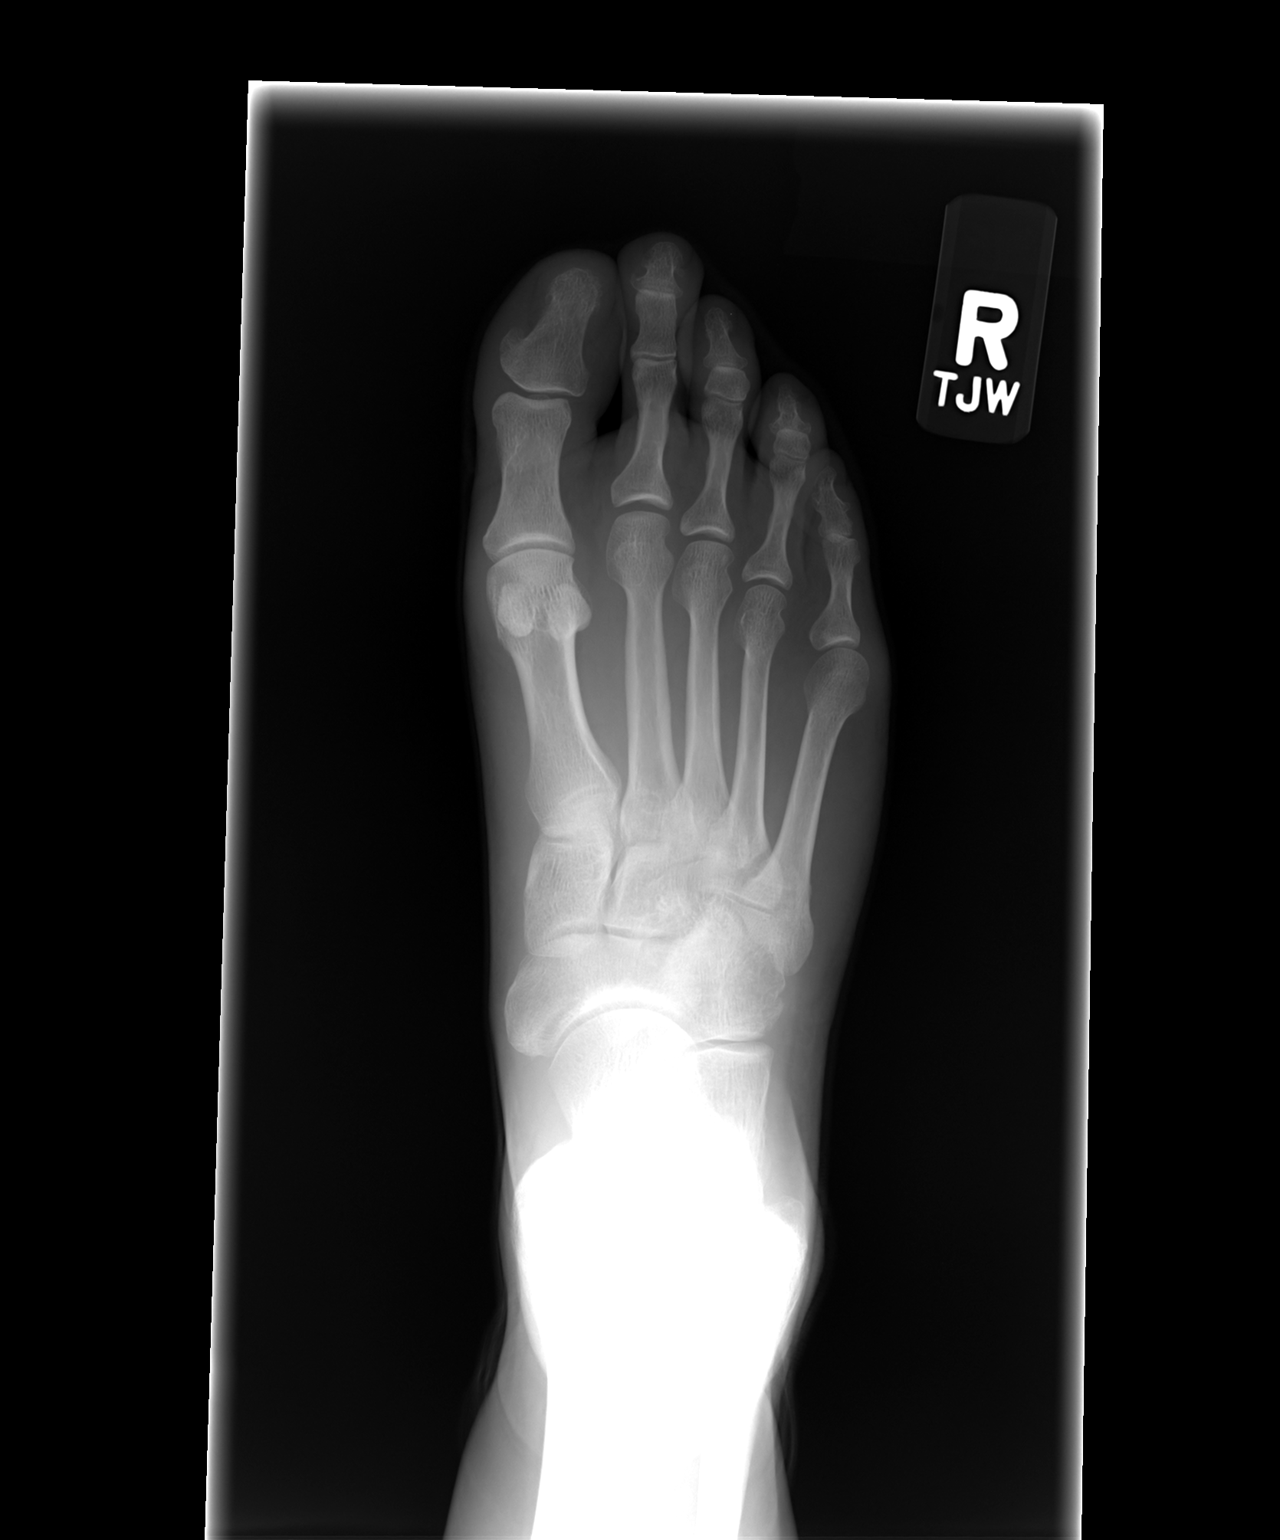

[view not recorded (2 of 3)]
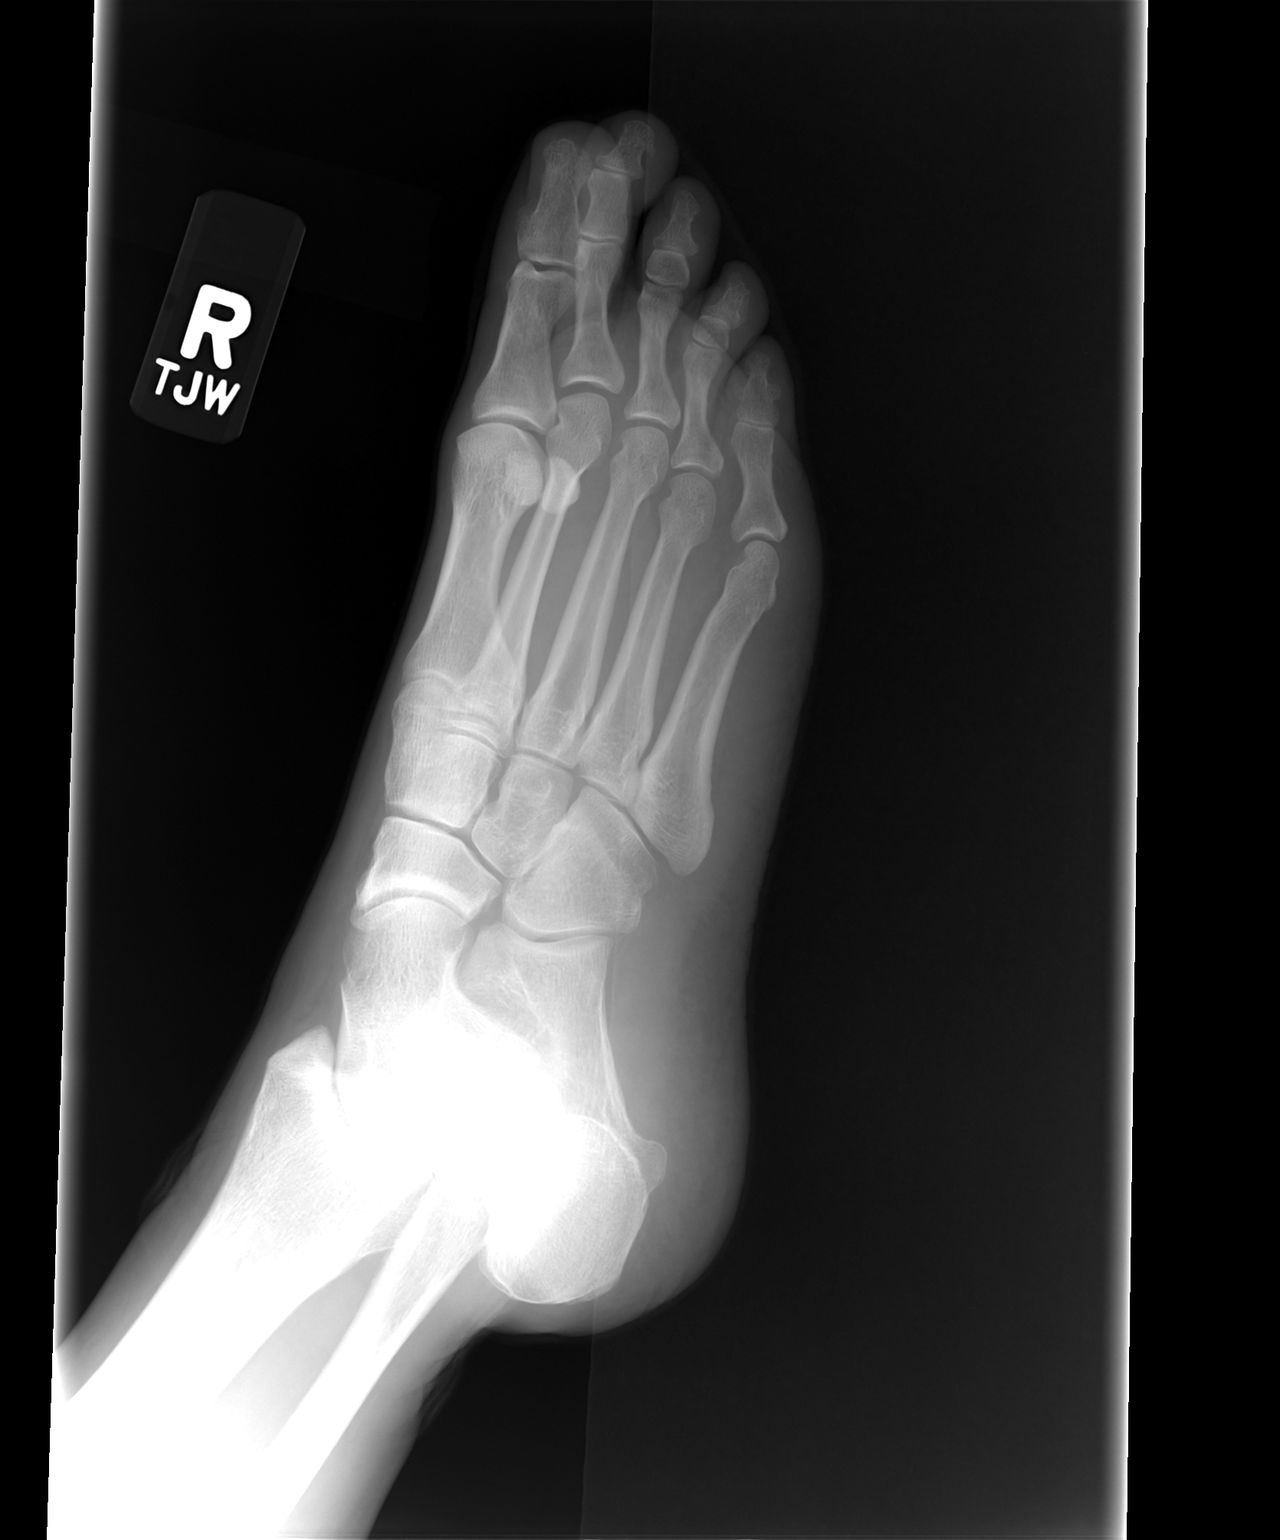

[view not recorded (3 of 3)]
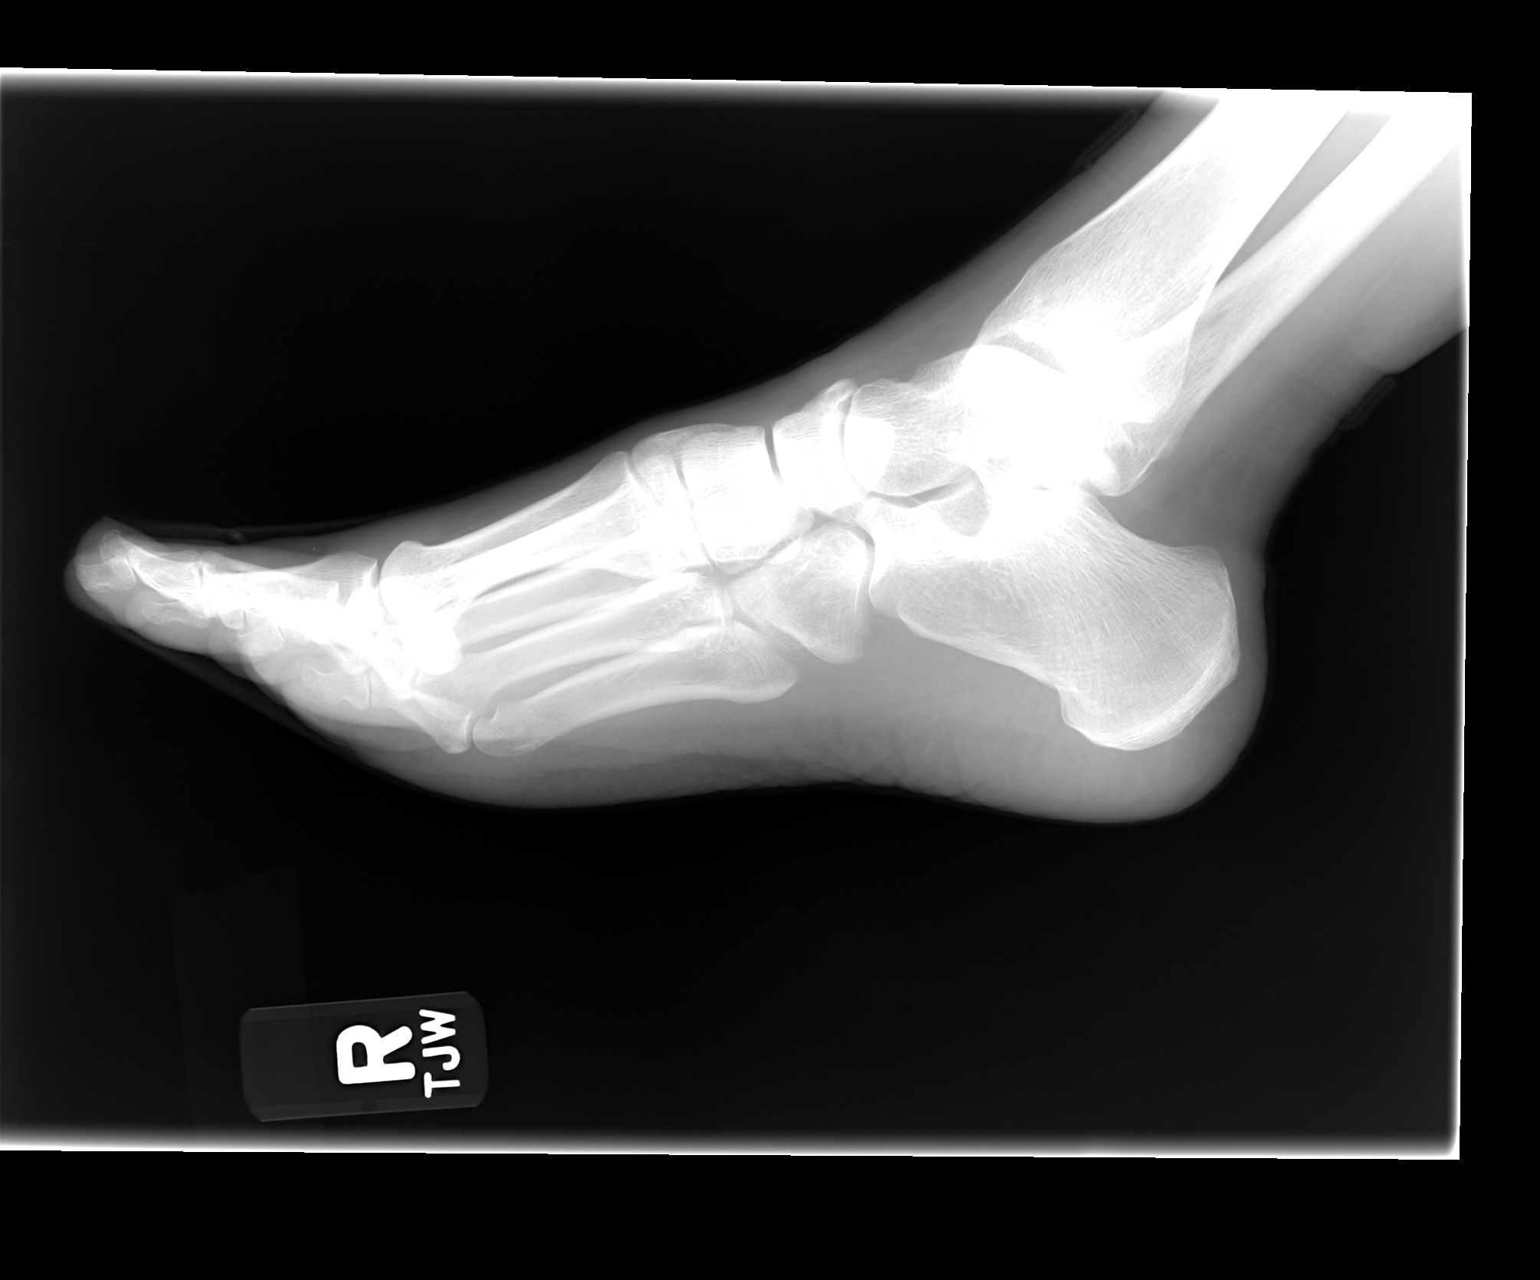

[3 of 3 positions shown; findings below may reference images not displayed]

FINDINGS: No fracture or dislocation is seen.

The joint spaces are preserved.

The visualized soft tissues are unremarkable.
IMPRESSION: No acute osseous abnormality is seen.

## 2015-03-11 ENCOUNTER — Encounter: Payer: Self-pay | Admitting: Internal Medicine

## 2015-03-11 ENCOUNTER — Ambulatory Visit (INDEPENDENT_AMBULATORY_CARE_PROVIDER_SITE_OTHER): Payer: BC Managed Care – PPO | Admitting: Internal Medicine

## 2015-03-11 VITALS — BP 110/70 | HR 78 | Temp 97.5°F | Wt 145.0 lb

## 2015-03-11 DIAGNOSIS — R300 Dysuria: Secondary | ICD-10-CM

## 2015-03-11 DIAGNOSIS — N3000 Acute cystitis without hematuria: Secondary | ICD-10-CM | POA: Diagnosis not present

## 2015-03-11 LAB — POCT URINALYSIS DIPSTICK
BILIRUBIN UA: NEGATIVE
Glucose, UA: NEGATIVE
KETONES UA: NEGATIVE
Nitrite, UA: NEGATIVE
Protein, UA: NEGATIVE
RBC UA: NEGATIVE
Spec Grav, UA: 1.01
Urobilinogen, UA: NEGATIVE
pH, UA: 6.5

## 2015-03-11 MED ORDER — CIPROFLOXACIN HCL 250 MG PO TABS: 250.0000 mg | ORAL_TABLET | Freq: Two times a day (BID) | ORAL | Status: DC

## 2015-03-11 NOTE — Patient Instructions (Signed)
If your symptoms are better with just 1-2 doses of the medication, you can stop after 3 days.

## 2015-03-11 NOTE — Assessment & Plan Note (Signed)
Seems to be isolated to bladder Will treat again with cipro since she has tolerated well May only need 3 days

## 2015-03-11 NOTE — Progress Notes (Signed)
   Subjective:    Patient ID: Kaitlin Fuller, female    DOB: 10/16/1965, 49 y.o.   MRN: [id number]  HPI Here due to urinary symptoms  Having urgency--then can't go Feels abnormal with voiding--though not overtly burning Low back pain Started yesterday Some chills last night No hematuria No fever and doesn't have sweats  Current Outpatient Prescriptions on File Prior to Visit  Medication Sig Dispense Refill  . fish oil-omega-3 fatty acids 1000 MG capsule Take 1 g by mouth 3 (three) times daily.      . Multiple Vitamins-Minerals (COMPLETE WOMENS PO) Take 1 tablet by mouth daily.      . naproxen (NAPROSYN) 500 MG tablet Take 500 mg by mouth as needed.    . vitamin C (ASCORBIC ACID) 500 MG tablet Take 500 mg by mouth daily.     No current facility-administered medications on file prior to visit.    No Known Allergies  No past medical history on file.  No past surgical history on file.  No family history on file.  Social History   Social History  . Marital Status: Married    Spouse Name: N/A  . Number of Children: N/A  . Years of Education: N/A   Occupational History  . Not on file.   Social History Main Topics  . Smoking status: Never Smoker   . Smokeless tobacco: Never Used  . Alcohol Use: 0.0 oz/week    0 Standard drinks or equivalent per week     Comment: socially  . Drug Use: No  . Sexual Activity: Not on file   Other Topics Concern  . Not on file   Social History Narrative   Husband is a Investment banker, corporate, 2nd marriage   Exercises regular including yoga   Review of Systems  No nausea or vomiting  Appetite is okay No recent intercourse LMP ~ 10 days ago    Objective:   Physical Exam  Constitutional: She appears well-developed and well-nourished. No distress.  Abdominal:  Slight tenderness to the left of suprapubic area--mild  Musculoskeletal:  Mild tenderness in left lateral low back--not CVA tenderness          Assessment & Plan:

## 2015-03-11 NOTE — Progress Notes (Signed)
Pre visit review using our clinic review tool, if applicable. No additional management support is needed unless otherwise documented below in the visit note. 

## 2015-09-15 ENCOUNTER — Encounter: Payer: Self-pay | Admitting: Gastroenterology

## 2015-10-29 ENCOUNTER — Ambulatory Visit (AMBULATORY_SURGERY_CENTER): Payer: Self-pay | Admitting: *Deleted

## 2015-10-29 VITALS — Ht 64.0 in | Wt 150.0 lb

## 2015-10-29 DIAGNOSIS — Z1211 Encounter for screening for malignant neoplasm of colon: Secondary | ICD-10-CM

## 2015-10-29 MED ORDER — NA SULFATE-K SULFATE-MG SULF 17.5-3.13-1.6 GM/177ML PO SOLN: 1.0000 | Freq: Once | ORAL | Status: DC

## 2015-10-29 NOTE — Progress Notes (Signed)
No egg or soy allergy known to patient  No issues with past sedation with any surgeries  or procedures, no intubation problems  No diet pills per patient at this time- instructed stay off phentermine until after colon  No home 02 use per patient  No blood thinners per patient  Pt denies issues with constipation  emmi video to e mail

## 2015-11-02 ENCOUNTER — Encounter: Payer: Self-pay | Admitting: Gastroenterology

## 2015-11-15 ENCOUNTER — Ambulatory Visit (AMBULATORY_SURGERY_CENTER): Payer: BC Managed Care – PPO | Admitting: Gastroenterology

## 2015-11-15 ENCOUNTER — Encounter: Payer: Self-pay | Admitting: Gastroenterology

## 2015-11-15 VITALS — BP 108/58 | HR 58 | Temp 98.4°F | Resp 15 | Ht 64.0 in | Wt 150.0 lb

## 2015-11-15 DIAGNOSIS — D122 Benign neoplasm of ascending colon: Secondary | ICD-10-CM | POA: Diagnosis not present

## 2015-11-15 DIAGNOSIS — Z1211 Encounter for screening for malignant neoplasm of colon: Secondary | ICD-10-CM

## 2015-11-15 MED ORDER — SODIUM CHLORIDE 0.9 % IV SOLN: 500.0000 mL | INTRAVENOUS | Status: DC

## 2015-11-15 NOTE — Progress Notes (Signed)
Called to room to assist during endoscopic procedure.  Patient ID and intended procedure confirmed with present staff. Received instructions for my participation in the procedure from the performing physician.  

## 2015-11-15 NOTE — Progress Notes (Signed)
A/ox3, pleased with MAC, report to RN 

## 2015-11-15 NOTE — Op Note (Addendum)
[facility] Patient Name: Kaitlin Fuller Procedure Date: 11/15/2015 7:49 AM MRN: [id number] Endoscopist: Milus Banister , MD Age: 50 Referring MD:  Date of Birth: 02/22/1966 Gender: Female Account #: [id number] Procedure:                Colonoscopy Indications:              Screening for colorectal malignant neoplasm, This                            is the patient's first colonoscopy Medicines:                Monitored Anesthesia Care Procedure:                Pre-Anesthesia Assessment:                           - Prior to the procedure, a History and Physical                            was performed, and patient medications and                            allergies were reviewed. The patient's tolerance of                            previous anesthesia was also reviewed. The risks                            and benefits of the procedure and the sedation                            options and risks were discussed with the patient.                            All questions were answered, and informed consent                            was obtained. Prior Anticoagulants: The patient has                            taken no previous anticoagulant or antiplatelet                            agents. ASA Grade Assessment: II - A patient with                            mild systemic disease. After reviewing the risks                            and benefits, the patient was deemed in                            satisfactory condition to undergo the procedure.  After obtaining informed consent, the colonoscope                            was passed under direct vision. Throughout the                            procedure, the patient's blood pressure, pulse, and                            oxygen saturations were monitored continuously. The                            Model PCF-H190L [id number]) scope was introduced                            through the anus and  advanced to the the cecum,                            identified by appendiceal orifice and ileocecal                            valve. The colonoscopy was performed without                            difficulty. The patient tolerated the procedure                            well. The quality of the bowel preparation was                            excellent. The ileocecal valve, appendiceal                            orifice, and rectum were photographed. Scope In: 7:57:47 AM Scope Out: 8:08:31 AM Scope Withdrawal Time: 0 hours 6 minutes 47 seconds  Total Procedure Duration: 0 hours 10 minutes 44 seconds  Findings:                 A 3 mm polyp was found in the ascending colon. The                            polyp was sessile. The polyp was removed with a                            cold snare. Resection and retrieval were complete.                           Many small-mouthed diverticula were found in the                            left colon.                           The exam was otherwise without abnormality on  direct and retroflexion views. Complications:            No immediate complications. Estimated blood loss:                            None. Estimated Blood Loss:     Estimated blood loss: none. Impression:               - One 3 mm polyp in the ascending colon, removed                            with a cold snare. Resected and retrieved.                           - Diverticulosis in the left colon.                           - The examination was otherwise normal on direct                            and retroflexion views. Recommendation:           - Patient has a contact number available for                            emergencies. The signs and symptoms of potential                            delayed complications were discussed with the                            patient. Return to normal activities tomorrow.                            Written  discharge instructions were provided to the                            patient.                           - Resume previous diet.                           - Continue present medications.                           You will receive a letter within 2-3 weeks with the                            pathology results and my final recommendations.                           If the polyp(s) is proven to be 'pre-cancerous' on                            pathology, you will need repeat colonoscopy in 5  years. If the polyp(s) is NOT 'precancerous' on                            pathology then you should repeat colon cancer                            screening in 10 years with colonoscopy without need                            for colon cancer screening by any method prior to                            then (including stool testing). Milus Banister, MD 11/15/2015 8:10:56 AM This report has been signed electronically.

## 2015-11-15 NOTE — Patient Instructions (Signed)
Discharge instructions given. Handouts on polyps and diverticulosis. Resume previous medications. YOU HAD AN ENDOSCOPIC PROCEDURE TODAY AT THE [facility]:   Refer to the procedure report that was given to you for any specific questions about what was found during the examination.  If the procedure report does not answer your questions, please call your gastroenterologist to clarify.  If you requested that your care partner not be given the details of your procedure findings, then the procedure report has been included in a sealed envelope for you to review at your convenience later.  YOU SHOULD EXPECT: Some feelings of bloating in the abdomen. Passage of more gas than usual.  Walking can help get rid of the air that was put into your GI tract during the procedure and reduce the bloating. If you had a lower endoscopy (such as a colonoscopy or flexible sigmoidoscopy) you may notice spotting of blood in your stool or on the toilet paper. If you underwent a bowel prep for your procedure, you may not have a normal bowel movement for a few days.  Please Note:  You might notice some irritation and congestion in your nose or some drainage.  This is from the oxygen used during your procedure.  There is no need for concern and it should clear up in a day or so.  SYMPTOMS TO REPORT IMMEDIATELY:   Following lower endoscopy (colonoscopy or flexible sigmoidoscopy):  Excessive amounts of blood in the stool  Significant tenderness or worsening of abdominal pains  Swelling of the abdomen that is new, acute  Fever of 100F or higher   For urgent or emergent issues, a gastroenterologist can be reached at any hour by calling [phone].   DIET: Your first meal following the procedure should be a small meal and then it is ok to progress to your normal diet. Heavy or fried foods are harder to digest and may make you feel nauseous or bloated.  Likewise, meals heavy in dairy and vegetables can  increase bloating.  Drink plenty of fluids but you should avoid alcoholic beverages for 24 hours.  ACTIVITY:  You should plan to take it easy for the rest of today and you should NOT DRIVE or use heavy machinery until tomorrow (because of the sedation medicines used during the test).    FOLLOW UP: Our staff will call the number listed on your records the next business day following your procedure to check on you and address any questions or concerns that you may have regarding the information given to you following your procedure. If we do not reach you, we will leave a message.  However, if you are feeling well and you are not experiencing any problems, there is no need to return our call.  We will assume that you have returned to your regular daily activities without incident.  If any biopsies were taken you will be contacted by phone or by letter within the next 1-3 weeks.  Please call us at [phone] if you have not heard about the biopsies in 3 weeks.    SIGNATURES/CONFIDENTIALITY: You and/or your care partner have signed paperwork which will be entered into your electronic medical record.  These signatures attest to the fact that that the information above on your After Visit Summary has been reviewed and is understood.  Full responsibility of the confidentiality of this discharge information lies with you and/or your care-partner. 

## 2015-11-16 ENCOUNTER — Telehealth: Payer: Self-pay

## 2015-11-16 NOTE — Telephone Encounter (Signed)
  Follow up Call-  Call back number 11/15/2015  Post procedure Call Back phone  # [phone]  Permission to leave phone message Yes     Patient questions:   Do you have a fever, pain , or abdominal swelling? No. Pain Score  0 *  Have you tolerated food without any problems? Yes.    Have you been able to return to your normal activities? Yes.    Do you have any questions about your discharge instructions: Diet   No. Medications  No. Follow up visit  No.  Do you have questions or concerns about your Care? No.  Actions: * If pain score is 4 or above: No action needed, pain <4.

## 2015-11-28 ENCOUNTER — Encounter: Payer: Self-pay | Admitting: Gastroenterology

## 2018-02-28 ENCOUNTER — Ambulatory Visit (INDEPENDENT_AMBULATORY_CARE_PROVIDER_SITE_OTHER): Payer: BC Managed Care – PPO

## 2018-02-28 DIAGNOSIS — Z23 Encounter for immunization: Secondary | ICD-10-CM | POA: Diagnosis not present

## 2018-08-30 ENCOUNTER — Encounter: Payer: Self-pay | Admitting: Family Medicine

## 2018-09-02 ENCOUNTER — Other Ambulatory Visit: Payer: Self-pay

## 2018-09-02 ENCOUNTER — Encounter: Payer: Self-pay | Admitting: Family Medicine

## 2018-09-02 ENCOUNTER — Ambulatory Visit (INDEPENDENT_AMBULATORY_CARE_PROVIDER_SITE_OTHER): Payer: BC Managed Care – PPO | Admitting: Family Medicine

## 2018-09-02 DIAGNOSIS — F411 Generalized anxiety disorder: Secondary | ICD-10-CM | POA: Diagnosis not present

## 2018-09-02 MED ORDER — BUSPIRONE HCL 15 MG PO TABS: 7.5000 mg | ORAL_TABLET | Freq: Two times a day (BID) | ORAL | 11 refills | Status: DC

## 2018-09-02 NOTE — Assessment & Plan Note (Signed)
Mild in the past (no medication), now much worse with the Covid crisis  Disc panic attacks/symptoms No s/s of depression fortunately  Menopause is likely worsening this /but she tolerates  Good coping skills/enc more aerobic exercise enc  Also outdoor time  Self care importance stressed Good support (may benefit from CBT in the future Will try Buspar 7.5 mg bid with opt to titrate  Discussed expectations of this medication including time to effectiveness and mechanism of action, also poss of side effects (early and late)- including mental fuzziness, weight or appetite change, nausea and poss of worse dep or anxiety (even suicidal thoughts)  Pt voiced understanding and will stop med and update if this occurs  If intolerable side effects- will call and stop it  Disc poss mental fuzziness and nausea to begin with inst to update if she wants to titrate up  If not helpful we may try SSRI like zoloft

## 2018-09-02 NOTE — Progress Notes (Signed)
Virtual Visit via Video Note  I connected with Kaitlin Fuller on 09/02/18 at  9:30 AM EDT by a video enabled telemedicine application and verified that I am speaking with the correct person using two identifiers.  She is at home I am in my office    I discussed the limitations of evaluation and management by telemedicine and the availability of in person appointments. The patient expressed understanding and agreed to proceed.  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 -whole life  Has never liked shaking hands/etc Worse anxiety for 6 months (for example when driving)   Feels like she wants to get out of her body Hard to fall asleep  Lump in chest and stomach   Thinks she is having panic attacks  Is afraid to leave her house  "mind over matter is not working"   Has never been on medicine before  No symptoms of depression at all Not hopeless or tearful   She is currently working at home and financially secure Trying to limit news/ exposure to the issue when possible  Some walking/ laps around house and out doors   Doing deep breathing and meditation and yoga  She is talking to a friend in [place] - talks frequently  Also another friend out of state  Husband is a retired Engineer, drilling - talks to her about the practicality    Also some menopausal hot flashes - about 4 years /on and off  Caused some hair loss/itchy skin She sees Dr Helane Rima does not want hormones   occ takes melatonin to sleep- does feel wired when she tries to sleep  Review of Systems  Constitutional: Negative for chills, fever, malaise/fatigue and weight loss.  Eyes: Negative for blurred vision.  Respiratory: Negative for cough and shortness of breath.   Cardiovascular: Positive for palpitations. Negative for chest pain and leg swelling.       Feels like her heart pounds occasionally  Gastrointestinal: Negative for nausea and  vomiting.  Skin: Negative for rash.  Neurological: Negative for dizziness and headaches.  Psychiatric/Behavioral: Negative for depression, substance abuse and suicidal ideas. The patient is nervous/anxious and has insomnia.        Observations/Objective: Patient looks and sounds well  Slightly anxious but no pressured speech  Normal skin tone/ normal grooming  Answers questions appropriately /no cognitive problems noticed  Not sob or coughing and no hoarseness of voice    Assessment and Plan: Problem List Items Addressed This Visit      Other   Generalized anxiety disorder    Mild in the past (no medication), now much worse with the Covid crisis  Disc panic attacks/symptoms No s/s of depression fortunately  Menopause is likely worsening this /but she tolerates  Good coping skills/enc more aerobic exercise enc  Also outdoor time  Self care importance stressed Good support (may benefit from CBT in the future Will try Buspar 7.5 mg bid with opt to titrate  Discussed expectations of this medication including time to effectiveness and mechanism of action, also poss of side effects (early and late)- including mental fuzziness, weight or appetite change, nausea and poss of worse dep or anxiety (even suicidal thoughts)  Pt voiced understanding and will stop med and update if this occurs  If intolerable side effects- will call and stop it  Disc poss mental fuzziness and nausea to begin with inst to update if she  wants to titrate up  If not helpful we may try SSRI like zoloft         Relevant Medications   busPIRone (BUSPAR) 15 MG tablet        Follow Up Instructions:    I discussed the assessment and treatment plan with the patient. The patient was provided an opportunity to ask questions and all were answered. The patient agreed with the plan and demonstrated an understanding of the instructions.   The patient was advised to call back or seek an in-person evaluation if the  symptoms worsen or if the condition fails to improve as anticipated.     Loura Pardon, MD

## 2018-10-16 ENCOUNTER — Encounter: Payer: Self-pay | Admitting: Family Medicine

## 2018-10-17 MED ORDER — BUSPIRONE HCL 15 MG PO TABS: 15.0000 mg | ORAL_TABLET | Freq: Two times a day (BID) | ORAL | 11 refills | Status: DC

## 2018-11-01 ENCOUNTER — Encounter: Payer: Self-pay | Admitting: Family Medicine

## 2018-11-04 NOTE — Telephone Encounter (Signed)
Note done and in IN box Please send to her

## 2018-11-04 NOTE — Telephone Encounter (Signed)
Letter mailed today.

## 2018-12-16 ENCOUNTER — Encounter: Payer: Self-pay | Admitting: Physician Assistant

## 2018-12-16 ENCOUNTER — Telehealth: Payer: BC Managed Care – PPO | Admitting: Physician Assistant

## 2018-12-16 DIAGNOSIS — N390 Urinary tract infection, site not specified: Secondary | ICD-10-CM | POA: Diagnosis not present

## 2018-12-16 MED ORDER — NITROFURANTOIN MONOHYD MACRO 100 MG PO CAPS: 100.0000 mg | ORAL_CAPSULE | Freq: Two times a day (BID) | ORAL | 0 refills | Status: DC

## 2018-12-16 MED ORDER — PHENAZOPYRIDINE HCL 200 MG PO TABS: 200.0000 mg | ORAL_TABLET | Freq: Three times a day (TID) | ORAL | 0 refills | Status: DC

## 2018-12-16 NOTE — Progress Notes (Signed)
We are sorry that you are not feeling well.  Here is how we plan to help!  Based on what you shared with me it looks like you most likely have a simple urinary tract infection.  A UTI (Urinary Tract Infection) is a bacterial infection of the bladder.  Most cases of urinary tract infections are simple to treat but a key part of your care is to encourage you to drink plenty of fluids and watch your symptoms carefully.  I have prescribed MacroBid 100 mg twice a day for 5 days.  Your symptoms should gradually improve. Call us if the burning in your urine worsens, you develop worsening fever, back pain or pelvic pain or if your symptoms do not resolve after completing the antibiotic.  I have also prescribed phenazopyridine 200mg  three times daily with meals for 2 days.    Urinary tract infections can be prevented by drinking plenty of water to keep your body hydrated.  Also be sure when you wipe, wipe from front to back and don't hold it in!  If possible, empty your bladder every 4 hours.  Your e-visit answers were reviewed by a board certified advanced clinical practitioner to complete your personal care plan.  Depending on the condition, your plan could have included both over the counter or prescription medications.  If there is a problem please reply  once you have received a response from your provider.  Your safety is important to [place].  If you have drug allergies check your prescription carefully.    You can use MyChart to ask questions about today's visit, request a non-urgent call back, or ask for a work or school excuse for 24 hours related to this e-Visit. If it has been greater than 24 hours you will need to follow up with your provider, or enter a new e-Visit to address those concerns.   You will get an e-mail in the next two days asking about your experience.  I hope that your e-visit has been valuable and will speed your recovery. Thank you for using e-visits.   I spent 5-10  minutes on review and completion of this note- Lacy Duverney [facility]

## 2018-12-29 ENCOUNTER — Encounter: Payer: Self-pay | Admitting: Family Medicine

## 2019-01-28 ENCOUNTER — Ambulatory Visit (INDEPENDENT_AMBULATORY_CARE_PROVIDER_SITE_OTHER): Payer: BC Managed Care – PPO

## 2019-01-28 DIAGNOSIS — Z23 Encounter for immunization: Secondary | ICD-10-CM

## 2019-08-28 ENCOUNTER — Ambulatory Visit: Payer: BC Managed Care – PPO | Attending: Family

## 2019-08-28 DIAGNOSIS — Z23 Encounter for immunization: Secondary | ICD-10-CM

## 2019-08-28 NOTE — Progress Notes (Signed)
   Covid-19 Vaccination Clinic  Name:  Kaitlin Fuller    MRN: [id number] DOB: Mar 05, 1966  08/28/2019  [name] was observed post Covid-19 immunization for 15 minutes without incident. She was provided with Vaccine Information Sheet and instruction to access the V-Safe system.   [name] was instructed to call 911 with any severe reactions post vaccine: Marland Kitchen Difficulty breathing  . Swelling of face and throat  . A fast heartbeat  . A bad rash all over body  . Dizziness and weakness   Immunizations Administered    Name Date Dose VIS Date Route   Moderna COVID-19 Vaccine 08/28/2019 12:03 PM 0.5 mL 04/29/2019 Intramuscular   Manufacturer: Moderna   Lot: [id number]   [id number]

## 2019-09-15 ENCOUNTER — Encounter: Payer: Self-pay | Admitting: Family Medicine

## 2019-09-16 MED ORDER — BUSPIRONE HCL 30 MG PO TABS: 30.0000 mg | ORAL_TABLET | Freq: Two times a day (BID) | ORAL | 5 refills | Status: DC

## 2019-09-17 ENCOUNTER — Other Ambulatory Visit: Payer: Self-pay | Admitting: Family Medicine

## 2019-09-17 NOTE — Telephone Encounter (Signed)
Will route refill request to PCP due to pt and PCP discussing dose via mychart yesterday. Please advise

## 2019-09-30 ENCOUNTER — Ambulatory Visit: Payer: BC Managed Care – PPO | Attending: Family

## 2019-09-30 DIAGNOSIS — Z23 Encounter for immunization: Secondary | ICD-10-CM

## 2019-09-30 NOTE — Progress Notes (Signed)
   Covid-19 Vaccination Clinic  Name:  Kaitlin Fuller    MRN: [id number] DOB: 07-09-1965  09/30/2019  [name] was observed post Covid-19 immunization for 15 minutes without incident. She was provided with Vaccine Information Sheet and instruction to access the V-Safe system.   [name] was instructed to call 911 with any severe reactions post vaccine: Marland Kitchen Difficulty breathing  . Swelling of face and throat  . A fast heartbeat  . A bad rash all over body  . Dizziness and weakness   Immunizations Administered    Name Date Dose VIS Date Route   Moderna COVID-19 Vaccine 09/30/2019 11:54 AM 0.5 mL 04/2019 Intramuscular   Manufacturer: Moderna   Lot: [id number]   [id number]

## 2020-02-11 ENCOUNTER — Encounter: Payer: Self-pay | Admitting: Family Medicine

## 2020-02-11 DIAGNOSIS — E7800 Pure hypercholesterolemia, unspecified: Secondary | ICD-10-CM

## 2020-02-12 NOTE — Telephone Encounter (Signed)
Orders are in

## 2020-02-16 ENCOUNTER — Other Ambulatory Visit (INDEPENDENT_AMBULATORY_CARE_PROVIDER_SITE_OTHER): Payer: BC Managed Care – PPO

## 2020-02-16 ENCOUNTER — Other Ambulatory Visit: Payer: Self-pay

## 2020-02-16 DIAGNOSIS — E7800 Pure hypercholesterolemia, unspecified: Secondary | ICD-10-CM

## 2020-02-16 LAB — COMPREHENSIVE METABOLIC PANEL
ALT: 12 U/L (ref 0–35)
AST: 15 U/L (ref 0–37)
Albumin: 4.4 g/dL (ref 3.5–5.2)
Alkaline Phosphatase: 94 U/L (ref 39–117)
BUN: 12 mg/dL (ref 6–23)
CO2: 26 mEq/L (ref 19–32)
Calcium: 9.4 mg/dL (ref 8.4–10.5)
Chloride: 105 mEq/L (ref 96–112)
Creatinine, Ser: 0.82 mg/dL (ref 0.40–1.20)
GFR: 72.57 mL/min (ref >60.00)
Glucose, Bld: 107 mg/dL — ABNORMAL HIGH (ref 70–99)
Potassium: 4.2 mEq/L (ref 3.5–5.1)
Sodium: 139 mEq/L (ref 135–145)
Total Bilirubin: 0.7 mg/dL (ref 0.2–1.2)
Total Protein: 7.1 g/dL (ref 6.0–8.3)

## 2020-02-16 LAB — LIPID PANEL
Cholesterol: 220 mg/dL — ABNORMAL HIGH (ref 0–200)
HDL: 69.3 mg/dL (ref >39.00)
LDL Cholesterol: 125 mg/dL — ABNORMAL HIGH (ref 0–99)
NonHDL: 150.78
Total CHOL/HDL Ratio: 3
Triglycerides: 131 mg/dL (ref 0.0–149.0)
VLDL: 26.2 mg/dL (ref 0.0–40.0)

## 2020-02-18 ENCOUNTER — Telehealth (INDEPENDENT_AMBULATORY_CARE_PROVIDER_SITE_OTHER): Payer: BC Managed Care – PPO | Admitting: Family Medicine

## 2020-02-18 ENCOUNTER — Encounter: Payer: Self-pay | Admitting: Family Medicine

## 2020-02-18 ENCOUNTER — Other Ambulatory Visit: Payer: Self-pay

## 2020-02-18 VITALS — BP 136/74 | HR 68 | Temp 97.6°F

## 2020-02-18 DIAGNOSIS — R7309 Other abnormal glucose: Secondary | ICD-10-CM | POA: Insufficient documentation

## 2020-02-18 DIAGNOSIS — E7800 Pure hypercholesterolemia, unspecified: Secondary | ICD-10-CM

## 2020-02-18 DIAGNOSIS — R7303 Prediabetes: Secondary | ICD-10-CM | POA: Insufficient documentation

## 2020-02-18 DIAGNOSIS — N951 Menopausal and female climacteric states: Secondary | ICD-10-CM | POA: Diagnosis not present

## 2020-02-18 DIAGNOSIS — F411 Generalized anxiety disorder: Secondary | ICD-10-CM | POA: Diagnosis not present

## 2020-02-18 MED ORDER — BUSPIRONE HCL 30 MG PO TABS
30.0000 mg | ORAL_TABLET | Freq: Two times a day (BID) | ORAL | 11 refills | Status: DC
Start: 2020-02-18 — End: 2020-11-18

## 2020-02-18 NOTE — Assessment & Plan Note (Signed)
No doubt hormone change adds to her anxiety and irritability  buspar 30 mg bid is helpful (also may help hot flashes)  Disc imp of self care No period in 2 y

## 2020-02-18 NOTE — Assessment & Plan Note (Signed)
Disc goals for lipids and reasons to control them Rev last labs with pt Rev low sat fat diet in detail HDL is re assuring but need to watch LDL  Has fam hx of cholesterol/vascular problems  Will continue to follow-may need statin in the future

## 2020-02-18 NOTE — Assessment & Plan Note (Signed)
Worse in the setting of increased work hours/ pandemic and also menopause  Improved with buspar 30 mg bid Reviewed stressors/ coping techniques/symptoms/ support sources/ tx options and side effects in detail today  Self care discussed -not a lot of time More exercise/better diet and meditation may help Declines counseling but has good support and will reach out if she changes her mind

## 2020-02-18 NOTE — Assessment & Plan Note (Signed)
107 fasting  Has eaten poorly during the pandemic Plans to change disc imp of low glycemic diet and wt loss to prevent DM2   Will check a1c with next labs-most likely spring

## 2020-02-18 NOTE — Progress Notes (Signed)
Virtual Visit via Video Note  I connected with [name] on 02/18/20 at  4:00 PM EDT by a video enabled telemedicine application and verified that I am speaking with the correct person using two identifiers.  Location: Patient: work place Provider: office   I discussed the limitations of evaluation and management by telemedicine and the availability of in person appointments. The patient expressed understanding and agreed to proceed.  Parties involved in encounter  Patient: Kaitlin Fuller  Provider:  Loura Pardon MD    History of Present Illness: Pt presents for f/u of chronic health problems   Wt Readings from Last 3 Encounters:  11/15/15 150 lb (68 kg)  10/29/15 150 lb (68 kg)  03/11/15 145 lb (65.8 kg)    Was able to telework until august  Is a "germafobe"    gen anx disorder- got worse in April 2020  Worsened at that time with menopause and also the pandemic Taking buspar 30 mg bid  It does help   Husband is very supportive Does not feel the need for counseling    Self care-not great  Is working a lot -for a university (this is supposed to change/get easier) -January will change that  Frustrating  Eats too many carbs  Knows she needs to return to a better diet  Does yoga and needs to add more cardio Bought a jump rope    Struggles with menopause symptoms  Anxiety and hot flashes specifically  Hot flashes are a little less frequent  Gets irritable  Uses a hand held fan   LMP was 2 y ago    Hyperlipidemia Lab Results  Component Value Date   CHOL 220 (H) 02/16/2020   CHOL 204 (HH) 10/29/2006   Lab Results  Component Value Date   HDL 69.30 02/16/2020   HDL 86.2 10/29/2006   Lab Results  Component Value Date   LDLCALC 125 (H) 02/16/2020   Lab Results  Component Value Date   TRIG 131.0 02/16/2020   TRIG 115 10/29/2006   Lab Results  Component Value Date   CHOLHDL 3 02/16/2020   CHOLHDL 2.4 CALC 10/29/2006   Lab Results  Component Value  Date   LDLDIRECT 88.7 10/29/2006   Mother had CABG - CAD in her 32  Father has been on cholesterol med - since his 61s    Lab Results  Component Value Date   CREATININE 0.82 02/16/2020   BUN 12 02/16/2020   NA 139 02/16/2020   K 4.2 02/16/2020   CL 105 02/16/2020   CO2 26 02/16/2020   Glucose 107 fasting  Mother had diabetes  Patient Active Problem List   Diagnosis Date Noted  . Elevated glucose level 02/18/2020  . Generalized anxiety disorder 09/02/2018  . Acute cystitis 03/11/2015  . Right foot pain 06/25/2013  . BACK PAIN, LUMBAR 10/21/2008  . HYPERCHOLESTEROLEMIA, PURE 10/29/2006   Past Medical History:  Diagnosis Date  . 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 GUM SURGERY  2012  . TONSILLECTOMY     age 73   . WISDOM TOOTH EXTRACTION  1987   Social History   Tobacco Use  . Smoking status: Never Smoker  . Smokeless tobacco: Never Used  Substance Use Topics  . Alcohol use: Yes    Alcohol/week: 0.0 standard drinks    Comment: socially  . Drug use: No   Family History  Problem Relation Age  of Onset  . Colon polyps Mother   . Colon cancer Neg Hx   . Esophageal cancer Neg Hx   . Stomach cancer Neg Hx   . Rectal cancer Neg Hx    No Known Allergies Current Outpatient Medications on File Prior to Visit  Medication Sig Dispense Refill  . Cholecalciferol (VITAMIN D3) 50 MCG (2000 UT) capsule Take 1 capsule by mouth daily.    . Glucosamine-Chondroitin 1500-1200 MG/30ML LIQD Take by mouth daily.    . Multiple Vitamins-Minerals (COMPLETE WOMENS PO) Take 1 tablet by mouth daily.      . Nutritional Supplements (CALCIUM D-GLUCARATE PO) Take 2 capsules by mouth in the morning and at bedtime.    . PSYLLIUM HUSK PO Take by mouth daily.    . vitamin E 180 MG (400 UNITS) capsule Take 1 capsule by mouth daily.     No current facility-administered medications on file prior to visit.   Review of Systems   Constitutional: Positive for malaise/fatigue. Negative for chills and fever.  HENT: Negative for congestion, ear pain, sinus pain and sore throat.   Eyes: Negative for blurred vision, discharge and redness.  Respiratory: Negative for cough, shortness of breath and stridor.   Cardiovascular: Negative for chest pain, palpitations and leg swelling.  Gastrointestinal: Negative for abdominal pain, diarrhea, nausea and vomiting.  Genitourinary:       Hot flashes   Musculoskeletal: Negative for myalgias.  Skin: Negative for rash.  Neurological: Negative for dizziness and headaches.  Psychiatric/Behavioral: Negative for depression. The patient is nervous/anxious and has insomnia.       Observations/Objective: Patient appears well, in no distress Weight is baseline  No facial swelling or asymmetry Normal voice-not hoarse and no slurred speech No obvious tremor or mobility impairment Moving neck and UEs normally Able to hear the call well  No cough or shortness of breath during interview  Talkative and mentally sharp with no cognitive changes No skin changes on face or neck , no rash or pallor Affect is normal , cheerful and talkative today  Not tearful    Assessment and Plan: Problem List Items Addressed This Visit      Other   HYPERCHOLESTEROLEMIA, PURE    Disc goals for lipids and reasons to control them Rev last labs with pt Rev low sat fat diet in detail HDL is re assuring but need to watch LDL  Has fam hx of cholesterol/vascular problems  Will continue to follow-may need statin in the future      Generalized anxiety disorder - Primary    Worse in the setting of increased work hours/ pandemic and also menopause  Improved with buspar 30 mg bid Reviewed stressors/ coping techniques/symptoms/ support sources/ tx options and side effects in detail today  Self care discussed -not a lot of time More exercise/better diet and meditation may help Declines counseling but has good  support and will reach out if she changes her mind      Relevant Medications   busPIRone (BUSPAR) 30 MG tablet   Elevated glucose level    107 fasting  Has eaten poorly during the pandemic Plans to change disc imp of low glycemic diet and wt loss to prevent DM2   Will check a1c with next labs-most likely spring      Symptoms, such as flushing, sleeplessness, headache, lack of concentration, associated with the menopause    No doubt hormone change adds to her anxiety and irritability  buspar 30 mg bid is helpful (  also may help hot flashes)  Disc imp of self care No period in 2 y          Follow Up Instructions: Continue buspar at this dose  Work on healthy diet and exercise  Try to get most of your carbohydrates from produce (with the exception of white potatoes)  Eat less bread/pasta/rice/snack foods/cereals/sweets and other items from the middle of the grocery store (processed carbs)  Add more walking when you can  Let's consider re checking labs in the spring   Think about practicing some meditation   I discussed the assessment and treatment plan with the patient. The patient was provided an opportunity to ask questions and all were answered. The patient agreed with the plan and demonstrated an understanding of the instructions.   The patient was advised to call back or seek an in-person evaluation if the symptoms worsen or if the condition fails to improve as anticipated.     Loura Pardon, MD

## 2020-02-18 NOTE — Patient Instructions (Signed)
Continue buspar at this dose  Work on healthy diet and exercise  Try to get most of your carbohydrates from produce (with the exception of white potatoes)  Eat less bread/pasta/rice/snack foods/cereals/sweets and other items from the middle of the grocery store (processed carbs)  Add more walking when you can  Let's consider re checking labs in the spring   Think about practicing some meditation

## 2020-03-08 ENCOUNTER — Encounter: Payer: Self-pay | Admitting: Family Medicine

## 2020-11-18 ENCOUNTER — Other Ambulatory Visit: Payer: Self-pay | Admitting: Family Medicine

## 2020-12-27 ENCOUNTER — Encounter: Payer: Self-pay | Admitting: Gastroenterology

## 2021-02-04 ENCOUNTER — Other Ambulatory Visit: Payer: Self-pay | Admitting: Family Medicine

## 2021-02-08 NOTE — Telephone Encounter (Signed)
Please schedule patient with Dr Glori Bickers for follow up or CPE. Patient is overdue. Last visit 01/2020. Thank you

## 2021-02-09 DIAGNOSIS — Z0000 Encounter for general adult medical examination without abnormal findings: Secondary | ICD-10-CM

## 2021-02-09 DIAGNOSIS — E7800 Pure hypercholesterolemia, unspecified: Secondary | ICD-10-CM

## 2021-02-09 DIAGNOSIS — R7309 Other abnormal glucose: Secondary | ICD-10-CM

## 2021-02-09 NOTE — Telephone Encounter (Signed)
Lvm for pt to schedule cpe/labs and I also left a my chart message (tbuck)

## 2021-02-10 NOTE — Telephone Encounter (Signed)
2nd attemp to cat pt and vm full

## 2021-02-17 NOTE — Telephone Encounter (Signed)
Dr. Glori Bickers please see pt's mychart message. She wants to have her labs done at [facility] and then have a virtual CPE after that. If okay please order labs and also advise on the virtual visit part.

## 2021-02-17 NOTE — Telephone Encounter (Signed)
Can I schedule fasting labs at [facility] in [place] and then do my appointment with Dr. Glori Bickers virtually after she has the results a few days later?

## 2021-02-20 DIAGNOSIS — Z0000 Encounter for general adult medical examination without abnormal findings: Secondary | ICD-10-CM | POA: Insufficient documentation

## 2021-02-20 NOTE — Telephone Encounter (Signed)
I ordered future [facility] /lab collect order  Please let her know  Unsure if she will need a print out  Thanks

## 2021-02-23 ENCOUNTER — Encounter: Payer: BC Managed Care – PPO | Admitting: Family Medicine

## 2021-02-24 LAB — CBC WITH DIFFERENTIAL/PLATELET
Basophils Absolute: 0.1 10*3/uL (ref 0.0–0.2)
Basos: 1 %
EOS (ABSOLUTE): 0.1 10*3/uL (ref 0.0–0.4)
Eos: 1 %
Hematocrit: 42.8 % (ref 34.0–46.6)
Hemoglobin: 14.5 g/dL (ref 11.1–15.9)
Immature Grans (Abs): 0 10*3/uL (ref 0.0–0.1)
Immature Granulocytes: 0 %
Lymphocytes Absolute: 2.3 10*3/uL (ref 0.7–3.1)
Lymphs: 33 %
MCH: 28.8 pg (ref 26.6–33.0)
MCHC: 33.9 g/dL (ref 31.5–35.7)
MCV: 85 fL (ref 79–97)
Monocytes Absolute: 0.4 10*3/uL (ref 0.1–0.9)
Monocytes: 6 %
Neutrophils Absolute: 4 10*3/uL (ref 1.4–7.0)
Neutrophils: 59 %
Platelets: 248 10*3/uL (ref 150–450)
RBC: 5.04 x10E6/uL (ref 3.77–5.28)
RDW: 12.6 % (ref 11.7–15.4)
WBC: 7 10*3/uL (ref 3.4–10.8)

## 2021-02-24 LAB — COMPREHENSIVE METABOLIC PANEL
ALT: 16 IU/L (ref 0–32)
AST: 17 IU/L (ref 0–40)
Albumin/Globulin Ratio: 2 (ref 1.2–2.2)
Albumin: 4.9 g/dL (ref 3.8–4.9)
Alkaline Phosphatase: 104 IU/L (ref 44–121)
BUN/Creatinine Ratio: 18 (ref 9–23)
BUN: 18 mg/dL (ref 6–24)
Bilirubin Total: 0.7 mg/dL (ref 0.0–1.2)
CO2: 24 mmol/L (ref 20–29)
Calcium: 9.7 mg/dL (ref 8.7–10.2)
Chloride: 101 mmol/L (ref 96–106)
Creatinine, Ser: 1 mg/dL (ref 0.57–1.00)
Globulin, Total: 2.5 g/dL (ref 1.5–4.5)
Glucose: 99 mg/dL (ref 70–99)
Potassium: 4.4 mmol/L (ref 3.5–5.2)
Sodium: 139 mmol/L (ref 134–144)
Total Protein: 7.4 g/dL (ref 6.0–8.5)
eGFR: 67 mL/min/{1.73_m2} (ref >59)

## 2021-02-24 LAB — LIPID PANEL
Chol/HDL Ratio: 3.4 ratio (ref 0.0–4.4)
Cholesterol, Total: 274 mg/dL — ABNORMAL HIGH (ref 100–199)
HDL: 81 mg/dL (ref >39)
LDL Chol Calc (NIH): 174 mg/dL — ABNORMAL HIGH (ref 0–99)
Triglycerides: 109 mg/dL (ref 0–149)
VLDL Cholesterol Cal: 19 mg/dL (ref 5–40)

## 2021-02-24 LAB — TSH: TSH: 4.35 u[IU]/mL (ref 0.450–4.500)

## 2021-02-24 LAB — HEMOGLOBIN A1C
Est. average glucose Bld gHb Est-mCnc: 114 mg/dL
Hgb A1c MFr Bld: 5.6 % (ref 4.8–5.6)

## 2021-03-01 ENCOUNTER — Other Ambulatory Visit: Payer: Self-pay

## 2021-03-01 MED ORDER — BUSPIRONE HCL 30 MG PO TABS: 30.0000 mg | ORAL_TABLET | Freq: Two times a day (BID) | ORAL | 0 refills | Status: DC

## 2021-03-01 NOTE — Telephone Encounter (Signed)
Patient called and stated that she needed labs to get refill and she completed request. Please advise next steps.

## 2021-05-26 ENCOUNTER — Other Ambulatory Visit: Payer: Self-pay | Admitting: Family Medicine

## 2021-07-11 ENCOUNTER — Telehealth: Payer: Self-pay | Admitting: Family Medicine

## 2021-07-11 DIAGNOSIS — R7309 Other abnormal glucose: Secondary | ICD-10-CM

## 2021-07-11 DIAGNOSIS — E7800 Pure hypercholesterolemia, unspecified: Secondary | ICD-10-CM

## 2021-07-11 DIAGNOSIS — Z0000 Encounter for general adult medical examination without abnormal findings: Secondary | ICD-10-CM

## 2021-07-11 NOTE — Telephone Encounter (Signed)
-----   Message from Ellamae Sia sent at 07/01/2021  9:47 AM EST ----- Regarding: Lab orders for Tuesday, 2.14.23 Patient is scheduled for CPX labs, please order future labs, Thanks , Karna Christmas

## 2021-07-12 ENCOUNTER — Other Ambulatory Visit (INDEPENDENT_AMBULATORY_CARE_PROVIDER_SITE_OTHER): Payer: BC Managed Care – PPO

## 2021-07-12 ENCOUNTER — Other Ambulatory Visit: Payer: BC Managed Care – PPO

## 2021-07-12 ENCOUNTER — Other Ambulatory Visit: Payer: Self-pay

## 2021-07-12 DIAGNOSIS — E7800 Pure hypercholesterolemia, unspecified: Secondary | ICD-10-CM

## 2021-07-12 DIAGNOSIS — R7309 Other abnormal glucose: Secondary | ICD-10-CM | POA: Diagnosis not present

## 2021-07-12 DIAGNOSIS — Z0000 Encounter for general adult medical examination without abnormal findings: Secondary | ICD-10-CM

## 2021-07-12 LAB — CBC WITH DIFFERENTIAL/PLATELET
Basophils Absolute: 0 10*3/uL (ref 0.0–0.1)
Basophils Relative: 0.6 % (ref 0.0–3.0)
Eosinophils Absolute: 0.1 10*3/uL (ref 0.0–0.7)
Eosinophils Relative: 1.6 % (ref 0.0–5.0)
HCT: 43.1 % (ref 36.0–46.0)
Hemoglobin: 14.1 g/dL (ref 12.0–15.0)
Lymphocytes Relative: 33.3 % (ref 12.0–46.0)
Lymphs Abs: 2.1 10*3/uL (ref 0.7–4.0)
MCHC: 32.8 g/dL (ref 30.0–36.0)
MCV: 85.8 fl (ref 78.0–100.0)
Monocytes Absolute: 0.5 10*3/uL (ref 0.1–1.0)
Monocytes Relative: 7.3 % (ref 3.0–12.0)
Neutro Abs: 3.6 10*3/uL (ref 1.4–7.7)
Neutrophils Relative %: 57.2 % (ref 43.0–77.0)
Platelets: 223 10*3/uL (ref 150.0–400.0)
RBC: 5.02 Mil/uL (ref 3.87–5.11)
RDW: 14.3 % (ref 11.5–15.5)
WBC: 6.4 10*3/uL (ref 4.0–10.5)

## 2021-07-12 LAB — HEMOGLOBIN A1C: Hgb A1c MFr Bld: 5.8 % (ref 4.6–6.5)

## 2021-07-12 LAB — TSH: TSH: 3.66 u[IU]/mL (ref 0.35–5.50)

## 2021-07-13 LAB — COMPREHENSIVE METABOLIC PANEL
ALT: 14 U/L (ref 0–35)
AST: 18 U/L (ref 0–37)
Albumin: 4.9 g/dL (ref 3.5–5.2)
Alkaline Phosphatase: 87 U/L (ref 39–117)
BUN: 15 mg/dL (ref 6–23)
CO2: 30 mEq/L (ref 19–32)
Calcium: 10 mg/dL (ref 8.4–10.5)
Chloride: 102 mEq/L (ref 96–112)
Creatinine, Ser: 0.92 mg/dL (ref 0.40–1.20)
GFR: 70 mL/min (ref >60.00)
Glucose, Bld: 99 mg/dL (ref 70–99)
Potassium: 4.9 mEq/L (ref 3.5–5.1)
Sodium: 141 mEq/L (ref 135–145)
Total Bilirubin: 0.8 mg/dL (ref 0.2–1.2)
Total Protein: 7.3 g/dL (ref 6.0–8.3)

## 2021-07-13 LAB — LIPID PANEL
Cholesterol: 222 mg/dL — ABNORMAL HIGH (ref 0–200)
HDL: 77.5 mg/dL (ref >39.00)
LDL Cholesterol: 129 mg/dL — ABNORMAL HIGH (ref 0–99)
NonHDL: 144.7
Total CHOL/HDL Ratio: 3
Triglycerides: 81 mg/dL (ref 0.0–149.0)
VLDL: 16.2 mg/dL (ref 0.0–40.0)

## 2021-07-18 ENCOUNTER — Ambulatory Visit (INDEPENDENT_AMBULATORY_CARE_PROVIDER_SITE_OTHER): Payer: BC Managed Care – PPO | Admitting: Family Medicine

## 2021-07-18 ENCOUNTER — Other Ambulatory Visit: Payer: Self-pay

## 2021-07-18 ENCOUNTER — Encounter: Payer: Self-pay | Admitting: Family Medicine

## 2021-07-18 VITALS — BP 118/72 | HR 73 | Temp 97.3°F | Ht 64.0 in | Wt 167.0 lb

## 2021-07-18 DIAGNOSIS — Z1211 Encounter for screening for malignant neoplasm of colon: Secondary | ICD-10-CM | POA: Diagnosis not present

## 2021-07-18 DIAGNOSIS — E7800 Pure hypercholesterolemia, unspecified: Secondary | ICD-10-CM

## 2021-07-18 DIAGNOSIS — Z23 Encounter for immunization: Secondary | ICD-10-CM

## 2021-07-18 DIAGNOSIS — F411 Generalized anxiety disorder: Secondary | ICD-10-CM | POA: Diagnosis not present

## 2021-07-18 DIAGNOSIS — R7309 Other abnormal glucose: Secondary | ICD-10-CM | POA: Diagnosis not present

## 2021-07-18 DIAGNOSIS — Z0000 Encounter for general adult medical examination without abnormal findings: Secondary | ICD-10-CM

## 2021-07-18 MED ORDER — SERTRALINE HCL 50 MG PO TABS: ORAL_TABLET | ORAL | 3 refills | Status: DC

## 2021-07-18 MED ORDER — ROSUVASTATIN CALCIUM 5 MG PO TABS: 5.0000 mg | ORAL_TABLET | Freq: Every day | ORAL | 3 refills | Status: DC

## 2021-07-18 NOTE — Assessment & Plan Note (Signed)
Lab Results  Component Value Date   HGBA1C 5.8 07/12/2021   Commended good health habits disc imp of low glycemic diet and wt loss to prevent DM2

## 2021-07-18 NOTE — Assessment & Plan Note (Signed)
Improved with better diet Disc goals for lipids and reasons to control them Rev last labs with pt Rev low sat fat diet in detail Pt is interested in statin  crestor 5 mg px  Will alert if side eff r echeck at f/u in 4-6 wk

## 2021-07-18 NOTE — Assessment & Plan Note (Signed)
More troublesome lately with addn of agoraphobia   Disc opt for tx Given # to look for counseling opportunities Sertraline px 25 mg to inc to 50  Discussed expectations of SSRI medication including time to effectiveness and mechanism of action, also poss of side effects (early and late)- including mental fuzziness, weight or appetite change, nausea and poss of worse dep or anxiety (even suicidal thoughts)  Pt voiced understanding and will stop med and update if this occurs   Will continue buspar 30 mg bid for now  Disc imp of self care F/u 4-6 wk or earlier if needed

## 2021-07-18 NOTE — Assessment & Plan Note (Signed)
Due for 5 y recall colonoscopy for polyps Order done Pt will call to schedule

## 2021-07-18 NOTE — Patient Instructions (Addendum)
Start crestor 5 mg daily in evening  If muscle pain - co Q 10 is fine but still let us know   Re check in 4-6 weeks   Start the sertraline (zoloft ) 1/2 pill daily 2 weeks then go up to 1 pill  If any intolerable side effects or if you feel worse hold it and let me know   Follow up in 4-6 weeks   Td (tetanus shot) today    Call [facility] to schedule your colonoscopy   Due to there being a large influx of referrals the GI offices are currently scheduling appointments as far out as late Aug/Sept 2022. They have asked that you call their office regarding your referral and scheduling needs. Otherwise, they will reach out to you as soon as they are able. They are working diligently to get patients called and scheduled as soon as possible.  Colonoscopies will be called according to date/time of the referral entry as these are not of urgent need.    [facility]  [phone] [facility]  [phone] [facility]  [phone]  [facility] can be considered as well. They may be able to book sooner appointments. You can reach [facility] at [phone].   Thank you for your patience and please let us know if you have any questions or concerns.    Thank You!  - [place] Referrals   For therapy/counseling for anxiety I recommend reaching out to someone   [facility] center [address] [place], [place] [address]  [phone]  [facility]/ Dr Lennice Sites Address: [address] [place], [place] [address] Phone: [phone]  Allison[facility] PA Address: [address], [place], [place] [address] Phone: [phone]  [facility] [address], [place],  [address] [phone]

## 2021-07-18 NOTE — Assessment & Plan Note (Signed)
Reviewed health habits including diet and exercise and skin cancer prevention Reviewed appropriate screening tests for age  Also reviewed health mt list, fam hx and immunization status , as well as social and family history   See HPI Labs reviewed  Tdap updated Colonoscopy ordered-pt will schedule Sent to gyn office for mam/pap reports -per pt utd

## 2021-07-18 NOTE — Progress Notes (Signed)
Subjective:    Patient ID: Kaitlin Fuller, female    DOB: 01-Aug-1965, 56 y.o.   MRN: [id number]  This visit occurred during the SARS-CoV-2 public health emergency.  Safety protocols were in place, including screening questions prior to the visit, additional usage of staff PPE, and extensive cleaning of exam room while observing appropriate contact time as indicated for disinfecting solutions.   HPI Here for health maintenance exam and to review chronic medical problems    Wt Readings from Last 3 Encounters:  07/18/21 167 lb (75.8 kg)  11/15/15 150 lb (68 kg)  10/29/15 150 lb (68 kg)   28.67 kg/m  Doing ok overall  Walking 1.68 mi daily with hand weights  Does not weigh herself    Tetanus shot -will get Tdap today  Flu shot 02/2021 Covid shot 02/2021 Zoster status -had shingrix vaccines  Colonoscopy 10/2015 with 5 y recall for adenomatous polyp  Wants to schedule that    Mammogram  -gets at Dr Christen Butter office   (? 2017)  Self breast exam - nl lumps   Pap 08/2014 at gyn See gyn -[facility]  BP Readings from Last 3 Encounters:  07/18/21 118/72  02/18/20 136/74  11/15/15 (!) 108/58   Pulse Readings from Last 3 Encounters:  07/18/21 73  02/18/20 68  11/15/15 (!) 58     Hyperlipidemia  Lab Results  Component Value Date   CHOL 222 (H) 07/12/2021   CHOL 274 (H) 02/23/2021   CHOL 220 (H) 02/16/2020   Lab Results  Component Value Date   HDL 77.50 07/12/2021   HDL 81 02/23/2021   HDL 69.30 02/16/2020   Lab Results  Component Value Date   LDLCALC 129 (H) 07/12/2021   LDLCALC 174 (H) 02/23/2021   LDLCALC 125 (H) 02/16/2020   Lab Results  Component Value Date   TRIG 81.0 07/12/2021   TRIG 109 02/23/2021   TRIG 131.0 02/16/2020   Lab Results  Component Value Date   CHOLHDL 3 07/12/2021   CHOLHDL 3.4 02/23/2021   CHOLHDL 3 02/16/2020   Lab Results  Component Value Date   LDLDIRECT 88.7 10/29/2006  No meat No cheese No ice cream  Really watching diet!    The 10-year ASCVD risk score (Arnett DK, et al., 2019) is: 1.4%   Values used to calculate the score:     Age: 26 years     Sex: Female     Is Non-Hispanic African American: No     Diabetic: No     Tobacco smoker: No     Systolic Blood Pressure: 329 mmHg     Is BP treated: No     HDL Cholesterol: 77.5 mg/dL     Total Cholesterol: 222 mg/dL  Both parents were on statins in 40s    Elevated glucose Lab Results  Component Value Date   HGBA1C 5.8 07/12/2021     Gen anx disorder:  Buspar 30 mg bid -helps some  Worse with the pandemic  Can control her inside world but not outside/is afraid to leave her house and afraid of un masked people  No social anxiety when on zoom   (she teaches on line)  A little decreased motivation   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     Hot flash can bring it on  She grinds her teeth more  Doing guided meditation now  May be open to therapy   Husband is a retired Engineer, drilling - she talks to him  [place] -unsure if helping yet   Tinnitus-more bothersome at night  Wakes up with it     Lab Results  Component Value Date   CREATININE 0.92 07/12/2021   BUN 15 07/12/2021   NA 141 07/12/2021   K 4.9 07/12/2021   CL 102 07/12/2021   CO2 30 07/12/2021   Lab Results  Component Value Date   ALT 14 07/12/2021   AST 18 07/12/2021   ALKPHOS 87 07/12/2021   BILITOT 0.8 07/12/2021   Lab Results  Component Value Date   WBC 6.4 07/12/2021   HGB 14.1 07/12/2021   HCT 43.1 07/12/2021   MCV 85.8 07/12/2021   PLT 223.0 07/12/2021   Lab Results  Component Value Date   TSH 3.66 07/12/2021   Patient Active Problem List   Diagnosis Date Noted   Colon cancer screening 07/18/2021   Routine general medical examination at a health care facility 02/20/2021   Elevated glucose level 02/18/2020   Symptoms, such as flushing, sleeplessness, headache, lack of concentration, associated with the menopause 02/18/2020   Generalized anxiety disorder  09/02/2018   Right foot pain 06/25/2013   BACK PAIN, LUMBAR 10/21/2008   HYPERCHOLESTEROLEMIA, PURE 10/29/2006   Past Medical History:  Diagnosis Date   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    Past Surgical History:  Procedure Laterality Date   GUM SURGERY  2012   TONSILLECTOMY     age 31    [place]   Social History   Tobacco Use   Smoking status: Never   Smokeless tobacco: Never  Substance Use Topics   Alcohol use: Yes    Alcohol/week: 0.0 standard drinks    Comment: socially   Drug use: No   Family History  Problem Relation Age of Onset   Colon polyps Mother    Colon cancer Neg Hx    Esophageal cancer Neg Hx    Stomach cancer Neg Hx    Rectal cancer Neg Hx    No Known Allergies Current Outpatient Medications on File Prior to Visit  Medication Sig Dispense Refill   busPIRone (BUSPAR) 30 MG tablet TAKE 1 TABLET BY MOUTH 2 TIMES DAILY. 180 tablet 0   Cholecalciferol (VITAMIN D3) 50 MCG (2000 UT) capsule Take 1 capsule by mouth daily.     Glucosamine-Chondroitin 1500-1200 MG/30ML LIQD Take by mouth daily.     Multiple Vitamins-Minerals (COMPLETE WOMENS PO) Take 1 tablet by mouth daily.       Nutritional Supplements (CALCIUM D-GLUCARATE PO) Take 2 capsules by mouth in the morning and at bedtime.     vitamin E 180 MG (400 UNITS) capsule Take 1 capsule by mouth daily.     No current facility-administered medications on file prior to visit.     Review of Systems  Constitutional:  Positive for fatigue. Negative for activity change, appetite change, fever and unexpected weight change.  HENT:  Positive for tinnitus. Negative for congestion, ear pain, rhinorrhea, sinus pressure and sore throat.   Eyes:  Negative for pain, redness and visual disturbance.  Respiratory:  Negative for cough, shortness of breath and wheezing.   Cardiovascular:  Negative for chest pain and palpitations.  Gastrointestinal:  Negative for abdominal pain, blood in stool,  constipation and diarrhea.  Endocrine: Negative for polydipsia and polyuria.  Genitourinary:  Negative for dysuria, frequency and urgency.  Musculoskeletal:  Negative for arthralgias, back pain and myalgias.  Skin:  Negative for pallor and rash.  Allergic/Immunologic: Negative  for environmental allergies.  Neurological:  Negative for dizziness, syncope and headaches.  Hematological:  Negative for adenopathy. Does not bruise/bleed easily.  Psychiatric/Behavioral:  Negative for decreased concentration and dysphoric mood. The patient is nervous/anxious.       Objective:   Physical Exam Constitutional:      General: She is not in acute distress.    Appearance: Normal appearance. She is well-developed and normal weight. She is not ill-appearing or diaphoretic.  HENT:     Head: Normocephalic and atraumatic.     Right Ear: Tympanic membrane, ear canal and external ear normal.     Left Ear: Tympanic membrane, ear canal and external ear normal.     Nose: Nose normal. No congestion.     Mouth/Throat:     Mouth: Mucous membranes are moist.     Pharynx: Oropharynx is clear. No posterior oropharyngeal erythema.  Eyes:     General: No scleral icterus.    Extraocular Movements: Extraocular movements intact.     Conjunctiva/sclera: Conjunctivae normal.     Pupils: Pupils are equal, round, and reactive to light.  Neck:     Thyroid: No thyromegaly.     Vascular: No carotid bruit or JVD.  Cardiovascular:     Rate and Rhythm: Normal rate and regular rhythm.     Pulses: Normal pulses.     Heart sounds: Normal heart sounds.    No gallop.  Pulmonary:     Effort: Pulmonary effort is normal. No respiratory distress.     Breath sounds: Normal breath sounds. No wheezing.     Comments: Good air exch Chest:     Chest wall: No tenderness.  Abdominal:     General: Bowel sounds are normal. There is no distension or abdominal bruit.     Palpations: Abdomen is soft. There is no mass.     Tenderness:  There is no abdominal tenderness.     Hernia: No hernia is present.  Genitourinary:    Comments: Breast and pelvic exam are done by gyn provider   Musculoskeletal:        General: No tenderness. Normal range of motion.     Cervical back: Normal range of motion and neck supple. No rigidity. No muscular tenderness.     Right lower leg: No edema.     Left lower leg: No edema.     Comments: No kyphosis   Lymphadenopathy:     Cervical: No cervical adenopathy.  Skin:    General: Skin is warm and dry.     Coloration: Skin is not pale.     Findings: No erythema or rash.     Comments: Solar lentigines diffusely   Neurological:     Mental Status: She is alert. Mental status is at baseline.     Cranial Nerves: No cranial nerve deficit.     Motor: No abnormal muscle tone.     Coordination: Coordination normal.     Gait: Gait normal.     Deep Tendon Reflexes: Reflexes are normal and symmetric. Reflexes normal.  Psychiatric:        Mood and Affect: Mood normal.        Cognition and Memory: Cognition and memory normal.          Assessment & Plan:   Problem List Items Addressed This Visit       Other   Colon cancer screening    Due for 5 y recall colonoscopy for polyps Order done Pt will call to schedule  Relevant Orders   Ambulatory referral to Gastroenterology   Elevated glucose level    Lab Results  Component Value Date   HGBA1C 5.8 07/12/2021  Commended good health habits disc imp of low glycemic diet and wt loss to prevent DM2       Generalized anxiety disorder    More troublesome lately with addn of agoraphobia   Disc opt for tx Given # to look for counseling opportunities Sertraline px 25 mg to inc to 50  Discussed expectations of SSRI medication including time to effectiveness and mechanism of action, also poss of side effects (early and late)- including mental fuzziness, weight or appetite change, nausea and poss of worse dep or anxiety (even suicidal  thoughts)  Pt voiced understanding and will stop med and update if this occurs   Will continue buspar 30 mg bid for now  Disc imp of self care F/u 4-6 wk or earlier if needed       Relevant Medications   sertraline (ZOLOFT) 50 MG tablet   HYPERCHOLESTEROLEMIA, PURE    Improved with better diet Disc goals for lipids and reasons to control them Rev last labs with pt Rev low sat fat diet in detail Pt is interested in statin  crestor 5 mg px  Will alert if side eff r echeck at f/u in 4-6 wk      Relevant Medications   rosuvastatin (CRESTOR) 5 MG tablet   Routine general medical examination at a health care facility - Primary    Reviewed health habits including diet and exercise and skin cancer prevention Reviewed appropriate screening tests for age  Also reviewed health mt list, fam hx and immunization status , as well as social and family history   See HPI Labs reviewed  Tdap updated Colonoscopy ordered-pt will schedule Sent to gyn office for mam/pap reports -per pt utd        Other Visit Diagnoses     Need for Td vaccine       Relevant Orders   Td : Tetanus/diphtheria >7yo Preservative  free (Completed)

## 2021-07-19 ENCOUNTER — Encounter: Payer: Self-pay | Admitting: Family Medicine

## 2021-08-09 ENCOUNTER — Other Ambulatory Visit: Payer: Self-pay | Admitting: Family Medicine

## 2021-08-15 ENCOUNTER — Ambulatory Visit: Payer: BC Managed Care – PPO | Admitting: Family Medicine

## 2021-08-18 ENCOUNTER — Telehealth: Payer: Self-pay | Admitting: Family Medicine

## 2021-08-18 DIAGNOSIS — E7800 Pure hypercholesterolemia, unspecified: Secondary | ICD-10-CM

## 2021-08-18 NOTE — Telephone Encounter (Signed)
-----   Message from Ellamae Sia sent at 08/15/2021  4:08 PM EDT ----- ?Regarding: Lab orders for Friday, 08/19/21 ?Lab orders for follow up appt please. ? ?

## 2021-08-19 ENCOUNTER — Other Ambulatory Visit (INDEPENDENT_AMBULATORY_CARE_PROVIDER_SITE_OTHER): Payer: BC Managed Care – PPO

## 2021-08-19 ENCOUNTER — Other Ambulatory Visit: Payer: Self-pay

## 2021-08-19 DIAGNOSIS — E7800 Pure hypercholesterolemia, unspecified: Secondary | ICD-10-CM | POA: Diagnosis not present

## 2021-08-19 LAB — ALT: ALT: 17 U/L (ref 0–35)

## 2021-08-19 LAB — LIPID PANEL
Cholesterol: 169 mg/dL (ref 0–200)
HDL: 72.7 mg/dL (ref >39.00)
LDL Cholesterol: 82 mg/dL (ref 0–99)
NonHDL: 96.12
Total CHOL/HDL Ratio: 2
Triglycerides: 71 mg/dL (ref 0.0–149.0)
VLDL: 14.2 mg/dL (ref 0.0–40.0)

## 2021-08-19 LAB — AST: AST: 16 U/L (ref 0–37)

## 2021-08-21 ENCOUNTER — Other Ambulatory Visit: Payer: Self-pay | Admitting: Family Medicine

## 2021-08-30 ENCOUNTER — Ambulatory Visit: Payer: BC Managed Care – PPO | Admitting: Family Medicine

## 2021-08-30 ENCOUNTER — Encounter: Payer: Self-pay | Admitting: Family Medicine

## 2021-08-30 VITALS — BP 124/80 | HR 65 | Temp 97.8°F | Ht 64.0 in | Wt 163.4 lb

## 2021-08-30 DIAGNOSIS — F411 Generalized anxiety disorder: Secondary | ICD-10-CM

## 2021-08-30 DIAGNOSIS — E7800 Pure hypercholesterolemia, unspecified: Secondary | ICD-10-CM

## 2021-08-30 MED ORDER — SERTRALINE HCL 50 MG PO TABS: 75.0000 mg | ORAL_TABLET | Freq: Every day | ORAL | 0 refills | Status: DC

## 2021-08-30 NOTE — Patient Instructions (Signed)
Go ahead and get established with a therapist  ? ?Go up on the sertraline to 75 mg daily  ?Give it a few months and let me know if you do not make more progress ? ?Keep meditating / practice  ? ?Make some plans for non work activities you enjoy  ? ? ? ?

## 2021-08-30 NOTE — Assessment & Plan Note (Signed)
Reviewed stressors/ coping techniques/symptoms/ support sources/ tx options and side effects in detail today ?Some improvement noted with Zoloft 50 mg daily, and no side effects ?Patient plans to reach out to her counseling program through work soon to start CBT  ?Encouraged strongly to continue practicing meditation as well ?She would like to go ahead and increase Zoloft dose to 75 mg daily ?Instructed to update if any side effects or intolerance or if symptoms worsen ?She will touch base with [place] in a few months re: how she is doing ?

## 2021-08-30 NOTE — Progress Notes (Signed)
? ?Subjective:  ? ? Patient ID: Kaitlin Fuller, female    DOB: 26-Dec-1965, 56 y.o.   MRN: [id number] ? ? ?HPI ?Pt presents for f/u of gen anxiety disorder and hyperlipidemia ? ?Wt Readings from Last 3 Encounters:  ?08/30/21 163 lb 6 oz (74.1 kg)  ?07/18/21 167 lb (75.8 kg)  ?11/15/15 150 lb (68 kg)  ? ?28.04 kg/m? ? ? ?Last visit px sertraline 25 mg to titrate up to 50 mg daily and given info on options for counseling  ?Was already taking buspar 30 mg bid  ? ?Is doing a little better  ?Zoloft takes the edge off  ?Less likely to go to a negative place in her head  ? ?Not sedated  ?No side effects  ?No diarrhea thankfully  ? ?Still doing some guided meditation and needs to work on that  ? ?Insurance does not cover mental health but she has some free counseling with work  ?Has read some self help things as well  ? 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 ?Sleep and appetite is ok  ? ? ?Px statin/crestor 5 mg daily for hyperlipidemia  ?No side effects from crestor  ?Lab Results  ?Component Value Date  ? CHOL 169 08/19/2021  ? HDL 72.70 08/19/2021  ? [place] 82 08/19/2021  ? LDLDIRECT 88.7 10/29/2006  ? TRIG 71.0 08/19/2021  ? CHOLHDL 2 08/19/2021  ? ?Improvement noted  ?Good diet - is careful  ?LDL down from 129  ? ?Has appt with gyn June 20th  ?Dr Helane Rima  ? ?Patient Active Problem List  ? Diagnosis Date Noted  ? Colon cancer screening 07/18/2021  ? Routine general medical examination at a health care facility 02/20/2021  ? Elevated glucose level 02/18/2020  ? Symptoms, such as flushing, sleeplessness, headache, lack of concentration, associated with the menopause 02/18/2020  ? Generalized anxiety disorder 09/02/2018  ? Right foot pain 06/25/2013  ? BACK PAIN, LUMBAR 10/21/2008  ? HYPERCHOLESTEROLEMIA, PURE 10/29/2006  ? ?Past Medical History:  ?Diagnosis Date  ? HPV in female   ? abnormal strain HPV   ? ?Past Surgical History:  ?Procedure Laterality Date  ? GUM SURGERY  2012   ? TONSILLECTOMY    ? age 65   ? [place] EXTRACTION  1987  ? ?Social History  ? ?Tobacco Use  ? Smoking status: Never  ? Smokeless tobacco: Never  ?Substance Use Topics  ? Alcohol use: Yes  ?  Alcohol/week: 0.0 standard drinks  ?  Comment: socially  ? Drug use: No  ? ?Family History  ?Problem Relation Age of Onset  ? Colon polyps Mother   ? Colon cancer Neg Hx   ? Esophageal cancer Neg Hx   ? Stomach cancer Neg Hx   ? Rectal cancer Neg Hx   ? ?No Known Allergies ?Current Outpatient Medications on File Prior to Visit  ?Medication Sig Dispense Refill  ? ASHWAGANDHA PO Take 3,000 mg by mouth in the morning and at bedtime.    ? busPIRone (BUSPAR) 30 MG tablet TAKE 1 TABLET BY MOUTH TWICE A DAY 180 tablet 0  ? Cholecalciferol (VITAMIN D3) 50 MCG (2000 UT) capsule Take 1 capsule by mouth daily.    ? Glucosamine-Chondroitin 1500-1200 MG/30ML LIQD Take by mouth daily.    ? Multiple Vitamins-Minerals (COMPLETE WOMENS PO) Take 1 tablet by mouth daily.      ? Nutritional Supplements (CALCIUM D-GLUCARATE PO) Take 2 capsules  by mouth in the morning and at bedtime.    ? rosuvastatin (CRESTOR) 5 MG tablet Take 1 tablet (5 mg total) by mouth daily. 90 tablet 3  ? vitamin E 180 MG (400 UNITS) capsule Take 1 capsule by mouth daily.    ? ?No current facility-administered medications on file prior to visit.  ?  ? ?Review of Systems  ?Constitutional:  Negative for activity change, appetite change, fatigue, fever and unexpected weight change.  ?HENT:  Negative for congestion, ear pain, rhinorrhea, sinus pressure and sore throat.   ?Eyes:  Negative for pain, redness and visual disturbance.  ?Respiratory:  Negative for cough, shortness of breath and wheezing.   ?Cardiovascular:  Negative for chest pain and palpitations.  ?Gastrointestinal:  Negative for abdominal pain, blood in stool, constipation and diarrhea.  ?Endocrine: Negative for polydipsia and polyuria.  ?Genitourinary:  Negative for dysuria, frequency and urgency.   ?Musculoskeletal:  Negative for arthralgias, back pain and myalgias.  ?Skin:  Negative for pallor and rash.  ?Allergic/Immunologic: Negative for environmental allergies.  ?Neurological:  Negative for dizziness, syncope and headaches.  ?Hematological:  Negative for adenopathy. Does not bruise/bleed easily.  ?Psychiatric/Behavioral:  Negative for decreased concentration, dysphoric mood, hallucinations and sleep disturbance. The patient is nervous/anxious.   ? ?   ?Objective:  ? Physical Exam ?Constitutional:   ?   General: She is not in acute distress. ?   Appearance: Normal appearance. She is normal weight. She is not ill-appearing or diaphoretic.  ?Eyes:  ?   General: No scleral icterus. ?   Conjunctiva/sclera: Conjunctivae normal.  ?   Pupils: Pupils are equal, round, and reactive to light.  ?Neck:  ?   Vascular: No carotid bruit.  ?Cardiovascular:  ?   Rate and Rhythm: Normal rate and regular rhythm.  ?   Heart sounds: Normal heart sounds.  ?Pulmonary:  ?   Effort: Pulmonary effort is normal. No respiratory distress.  ?   Breath sounds: Normal breath sounds.  ?Musculoskeletal:  ?   Cervical back: Normal range of motion and neck supple.  ?Lymphadenopathy:  ?   Cervical: No cervical adenopathy.  ?Skin: ?   Coloration: Skin is not pale.  ?Neurological:  ?   Mental Status: She is alert.  ?   Coordination: Coordination normal.  ?   Deep Tendon Reflexes: Reflexes normal.  ?   Comments: No tremor   ?Psychiatric:     ?   Attention and Perception: Attention normal.     ?   Mood and Affect: Mood normal. Mood is not anxious or depressed. Affect is not tearful.     ?   Speech: Speech normal.     ?   Behavior: Behavior normal.     ?   Cognition and Memory: Cognition and memory normal.  ?   Comments: Pleasant and talkative  ? ? ? ? ? ?   ?Assessment & Plan:  ? ?Problem List Items Addressed This Visit   ? ?  ? Other  ? Generalized anxiety disorder - Primary  ?  Reviewed stressors/ coping techniques/symptoms/ support sources/ tx  options and side effects in detail today ?Some improvement noted with Zoloft 50 mg daily, and no side effects ?Patient plans to reach out to her counseling program through work soon to start CBT  ?Encouraged strongly to continue practicing meditation as well ?She would like to go ahead and increase Zoloft dose to 75 mg daily ?Instructed to update if any side effects or intolerance or  if symptoms worsen ?She will touch base with [place] in a few months re: how she is doing ?  ?  ? Relevant Medications  ? sertraline (ZOLOFT) 50 MG tablet  ? HYPERCHOLESTEROLEMIA, PURE  ?  Disc goals for lipids and reasons to control them ?Rev last labs with pt ?Rev low sat fat diet in detail ?LDL came down from 1 29-82 with Crestor 5 mg daily ?She is tolerating this very well ?Diet remains good, low in saturated and trans fats ?Encouraged her to keep up the good work ?  ?  ? ? ?

## 2021-08-30 NOTE — Assessment & Plan Note (Signed)
Disc goals for lipids and reasons to control them ?Rev last labs with pt ?Rev low sat fat diet in detail ?LDL came down from 1 29-82 with Crestor 5 mg daily ?She is tolerating this very well ?Diet remains good, low in saturated and trans fats ?Encouraged her to keep up the good work ?

## 2021-10-09 ENCOUNTER — Other Ambulatory Visit: Payer: Self-pay | Admitting: Family Medicine

## 2021-10-10 NOTE — Telephone Encounter (Signed)
Is this okay to refill? 

## 2021-11-15 LAB — HM MAMMOGRAPHY

## 2021-11-16 LAB — HM PAP SMEAR: HM Pap smear: NEGATIVE

## 2022-07-03 ENCOUNTER — Encounter: Payer: Self-pay | Admitting: Family Medicine

## 2022-07-03 DIAGNOSIS — E7800 Pure hypercholesterolemia, unspecified: Secondary | ICD-10-CM

## 2022-07-06 ENCOUNTER — Other Ambulatory Visit (INDEPENDENT_AMBULATORY_CARE_PROVIDER_SITE_OTHER): Payer: BC Managed Care – PPO

## 2022-07-06 ENCOUNTER — Other Ambulatory Visit: Payer: Self-pay | Admitting: Family Medicine

## 2022-07-06 DIAGNOSIS — E7800 Pure hypercholesterolemia, unspecified: Secondary | ICD-10-CM | POA: Diagnosis not present

## 2022-07-07 LAB — LIPID PANEL
Cholesterol: 230 mg/dL — ABNORMAL HIGH (ref 0–200)
HDL: 86.8 mg/dL (ref >39.00)
LDL Cholesterol: 121 mg/dL — ABNORMAL HIGH (ref 0–99)
NonHDL: 142.78
Total CHOL/HDL Ratio: 3
Triglycerides: 107 mg/dL (ref 0.0–149.0)
VLDL: 21.4 mg/dL (ref 0.0–40.0)

## 2022-07-07 LAB — ALT: ALT: 18 U/L (ref 0–35)

## 2022-07-07 LAB — AST: AST: 21 U/L (ref 0–37)

## 2022-07-07 NOTE — Telephone Encounter (Signed)
Sine this message, pt viewed her results via mychart and sent a message regarding this med, please see mychart message

## 2022-07-07 NOTE — Telephone Encounter (Signed)
Will rout to PCP for review. Pt had lipid labs done yesterday and per phone note she wanted to make sure PCP doesn't think meds need to be adjusted before we refill

## 2022-08-30 ENCOUNTER — Encounter: Payer: Self-pay | Admitting: Family Medicine

## 2022-08-30 DIAGNOSIS — E7800 Pure hypercholesterolemia, unspecified: Secondary | ICD-10-CM

## 2022-09-12 ENCOUNTER — Other Ambulatory Visit (INDEPENDENT_AMBULATORY_CARE_PROVIDER_SITE_OTHER): Payer: BC Managed Care – PPO

## 2022-09-12 DIAGNOSIS — E7800 Pure hypercholesterolemia, unspecified: Secondary | ICD-10-CM

## 2022-09-12 LAB — LIPID PANEL
Cholesterol: 203 mg/dL — ABNORMAL HIGH (ref 0–200)
HDL: 77.9 mg/dL (ref >39.00)
LDL Cholesterol: 100 mg/dL — ABNORMAL HIGH (ref 0–99)
NonHDL: 125.47
Total CHOL/HDL Ratio: 3
Triglycerides: 125 mg/dL (ref 0.0–149.0)
VLDL: 25 mg/dL (ref 0.0–40.0)

## 2022-09-12 LAB — ALT: ALT: 19 U/L (ref 0–35)

## 2022-09-12 LAB — AST: AST: 17 U/L (ref 0–37)

## 2022-09-13 MED ORDER — ROSUVASTATIN CALCIUM 10 MG PO TABS: 10.0000 mg | ORAL_TABLET | Freq: Every day | ORAL | 3 refills | Status: DC

## 2022-09-13 NOTE — Addendum Note (Signed)
Addended by: Roxy Manns A on: 09/13/2022 03:28 PM   Modules accepted: Orders

## 2022-10-05 ENCOUNTER — Other Ambulatory Visit: Payer: Self-pay | Admitting: Family Medicine

## 2022-10-06 NOTE — Telephone Encounter (Signed)
Please schedule a follow up appt.

## 2022-10-06 NOTE — Telephone Encounter (Signed)
Pt has had recent lipid labs but hasn't been seen in over a year and no future appts., please advise

## 2022-10-09 NOTE — Telephone Encounter (Signed)
LVMTCB and sched

## 2022-10-17 ENCOUNTER — Encounter: Payer: Self-pay | Admitting: Family Medicine

## 2022-10-30 ENCOUNTER — Other Ambulatory Visit: Payer: Self-pay | Admitting: Family Medicine

## 2022-10-30 NOTE — Telephone Encounter (Signed)
LAST APPOINTMENT DATE: 08/30/21   NEXT APPOINTMENT DATE: 11/24/2022    LAST REFILL: 10/06/2022  QTY: #60 w/ no refills

## 2022-11-15 ENCOUNTER — Ambulatory Visit (AMBULATORY_SURGERY_CENTER): Payer: BC Managed Care – PPO

## 2022-11-15 ENCOUNTER — Encounter: Payer: Self-pay | Admitting: Gastroenterology

## 2022-11-15 VITALS — Ht 64.0 in | Wt 165.0 lb

## 2022-11-15 DIAGNOSIS — Z86010 Personal history of colonic polyps: Secondary | ICD-10-CM

## 2022-11-15 MED ORDER — NA SULFATE-K SULFATE-MG SULF 17.5-3.13-1.6 GM/177ML PO SOLN
1.0000 | Freq: Once | ORAL | 0 refills | Status: AC
Start: 2022-11-15 — End: 2022-11-15

## 2022-11-15 NOTE — Progress Notes (Signed)
No egg or soy allergy known to patient  No issues known to pt with past sedation with any surgeries or procedures Patient denies ever being told they had issues or difficulty with intubation  No FH of Malignant Hyperthermia Pt is not on diet pills Pt is not on  home 02  Pt is not on blood thinners  Pt denies issues with constipation  No A fib or A flutter Have any cardiac testing pending--no Pt instructed to use [email or web address] or GoodRx for a price reduction on prep  Can ambulate independently  

## 2022-11-16 ENCOUNTER — Telehealth: Payer: Self-pay | Admitting: Family Medicine

## 2022-11-16 DIAGNOSIS — Z0000 Encounter for general adult medical examination without abnormal findings: Secondary | ICD-10-CM

## 2022-11-16 DIAGNOSIS — E7800 Pure hypercholesterolemia, unspecified: Secondary | ICD-10-CM

## 2022-11-16 DIAGNOSIS — R7309 Other abnormal glucose: Secondary | ICD-10-CM

## 2022-11-16 NOTE — Telephone Encounter (Signed)
-----   Message from Terri J Walsh sent at 11/06/2022 10:29 AM EDT ----- Regarding: Lab orders for Friday, 6.21.24 Patient is scheduled for CPX labs, please order future labs, Thanks , Terri   

## 2022-11-17 ENCOUNTER — Other Ambulatory Visit (INDEPENDENT_AMBULATORY_CARE_PROVIDER_SITE_OTHER): Payer: BC Managed Care – PPO

## 2022-11-17 DIAGNOSIS — R7309 Other abnormal glucose: Secondary | ICD-10-CM

## 2022-11-17 DIAGNOSIS — E7800 Pure hypercholesterolemia, unspecified: Secondary | ICD-10-CM

## 2022-11-17 DIAGNOSIS — Z0000 Encounter for general adult medical examination without abnormal findings: Secondary | ICD-10-CM

## 2022-11-17 LAB — ALT: ALT: 16 U/L (ref 0–35)

## 2022-11-17 LAB — COMPREHENSIVE METABOLIC PANEL
ALT: 16 U/L (ref 0–35)
AST: 16 U/L (ref 0–37)
Albumin: 4.6 g/dL (ref 3.5–5.2)
Alkaline Phosphatase: 91 U/L (ref 39–117)
BUN: 16 mg/dL (ref 6–23)
CO2: 28 mEq/L (ref 19–32)
Calcium: 9.3 mg/dL (ref 8.4–10.5)
Chloride: 104 mEq/L (ref 96–112)
Creatinine, Ser: 0.81 mg/dL (ref 0.40–1.20)
GFR: 80.79 mL/min (ref >60.00)
Glucose, Bld: 100 mg/dL — ABNORMAL HIGH (ref 70–99)
Potassium: 4.2 mEq/L (ref 3.5–5.1)
Sodium: 141 mEq/L (ref 135–145)
Total Bilirubin: 0.8 mg/dL (ref 0.2–1.2)
Total Protein: 7.3 g/dL (ref 6.0–8.3)

## 2022-11-17 LAB — CBC WITH DIFFERENTIAL/PLATELET
Basophils Absolute: 0 10*3/uL (ref 0.0–0.1)
Basophils Relative: 0.5 % (ref 0.0–3.0)
Eosinophils Absolute: 0.1 10*3/uL (ref 0.0–0.7)
Eosinophils Relative: 1.4 % (ref 0.0–5.0)
HCT: 40.7 % (ref 36.0–46.0)
Hemoglobin: 13.4 g/dL (ref 12.0–15.0)
Lymphocytes Relative: 27 % (ref 12.0–46.0)
Lymphs Abs: 1.7 10*3/uL (ref 0.7–4.0)
MCHC: 32.9 g/dL (ref 30.0–36.0)
MCV: 88.8 fl (ref 78.0–100.0)
Monocytes Absolute: 0.4 10*3/uL (ref 0.1–1.0)
Monocytes Relative: 6.5 % (ref 3.0–12.0)
Neutro Abs: 4 10*3/uL (ref 1.4–7.7)
Neutrophils Relative %: 64.6 % (ref 43.0–77.0)
Platelets: 246 10*3/uL (ref 150.0–400.0)
RBC: 4.58 Mil/uL (ref 3.87–5.11)
RDW: 13.9 % (ref 11.5–15.5)
WBC: 6.2 10*3/uL (ref 4.0–10.5)

## 2022-11-17 LAB — LIPID PANEL
Cholesterol: 195 mg/dL (ref 0–200)
HDL: 72.8 mg/dL (ref >39.00)
LDL Cholesterol: 95 mg/dL (ref 0–99)
NonHDL: 122.04
Total CHOL/HDL Ratio: 3
Triglycerides: 135 mg/dL (ref 0.0–149.0)
VLDL: 27 mg/dL (ref 0.0–40.0)

## 2022-11-17 LAB — HEMOGLOBIN A1C: Hgb A1c MFr Bld: 5.4 % (ref 4.6–6.5)

## 2022-11-17 LAB — TSH: TSH: 2.22 u[IU]/mL (ref 0.35–5.50)

## 2022-11-17 LAB — AST: AST: 16 U/L (ref 0–37)

## 2022-11-24 ENCOUNTER — Encounter: Payer: BC Managed Care – PPO | Admitting: Family Medicine

## 2022-11-27 ENCOUNTER — Ambulatory Visit (INDEPENDENT_AMBULATORY_CARE_PROVIDER_SITE_OTHER): Payer: BC Managed Care – PPO | Admitting: Family Medicine

## 2022-11-27 ENCOUNTER — Encounter: Payer: Self-pay | Admitting: Family Medicine

## 2022-11-27 VITALS — BP 102/66 | HR 90 | Temp 97.6°F | Ht 63.75 in | Wt 173.5 lb

## 2022-11-27 DIAGNOSIS — Z1211 Encounter for screening for malignant neoplasm of colon: Secondary | ICD-10-CM

## 2022-11-27 DIAGNOSIS — R7309 Other abnormal glucose: Secondary | ICD-10-CM

## 2022-11-27 DIAGNOSIS — Z0000 Encounter for general adult medical examination without abnormal findings: Secondary | ICD-10-CM | POA: Diagnosis not present

## 2022-11-27 DIAGNOSIS — E7800 Pure hypercholesterolemia, unspecified: Secondary | ICD-10-CM | POA: Diagnosis not present

## 2022-11-27 DIAGNOSIS — F411 Generalized anxiety disorder: Secondary | ICD-10-CM | POA: Diagnosis not present

## 2022-11-27 DIAGNOSIS — N951 Menopausal and female climacteric states: Secondary | ICD-10-CM

## 2022-11-27 MED ORDER — SERTRALINE HCL 50 MG PO TABS: 75.0000 mg | ORAL_TABLET | Freq: Every day | ORAL | 3 refills | Status: DC

## 2022-11-27 NOTE — Assessment & Plan Note (Signed)
Some improvement with counseling and meditation  Encouraged good health habits  Plan to continue  Sertraline 75 mg dialy  Buspar 30 mg bid   Still having menopausal changes

## 2022-11-27 NOTE — Progress Notes (Signed)
Subjective:    Patient ID: Kaitlin Fuller, female    DOB: [birth year], 57 y.o.   MRN: [id number]  HPI  Here for health maintenance exam and to review chronic medical problems   Wt Readings from Last 3 Encounters:  11/27/22 173 lb 8 oz (78.7 kg)  11/15/22 165 lb (74.8 kg)  08/30/21 163 lb 6 oz (74.1 kg)   30.02 kg/m  Vitals:   11/27/22 1401  BP: 102/66  Pulse: 90  Temp: 97.6 F (36.4 C)  SpO2: 97%    Immunization History  Administered Date(s) Administered   COVID-19, mRNA, vaccine(Comirnaty)12 years and older 10/28/2022   Influenza,inj,Quad PF,6+ Mos 02/28/2018, 01/28/2019, 03/24/2020, 03/15/2021, 03/10/2022   Moderna Sars-Covid-2 Vaccination 08/28/2019, 09/30/2019   PFIZER Comirnaty(Gray Top)Covid-19 Tri-Sucrose Vaccine 09/24/2020   PFIZER(Purple Top)SARS-COV-2 Vaccination 04/23/2020   Pfizer Covid-19 Vaccine Bivalent Booster 55yrs & up 03/15/2021   Td 07/18/2021   Tdap 08/30/2021   Varicella 07/09/2020   Zoster Recombinant(Shingrix) 07/09/2020, 10/04/2020    Health Maintenance Due  Topic Date Due   Colonoscopy  11/14/2020    Doing ok overall  Taking care of herself   Mammogram 12/2021 -at [facility] office  Self breast exam  Gyn care/ pap -sees gyn  Will have dexa next month      Colon cancer screening : colonoscopy 2017 - ? 5 y recall  Has it scheduled July 10th   Bone health   Falls- none  Fractures-none  Supplements - taking vit D 3 Exercise - some    Mood History of GAD   zoloft 75 mg daily  Buspar for this and menopause symptoms   Did some counseling  Now she has coping mechanisms and this is helpful  Meditation if helpful so far   Was exercising until it got hot  Now trying to do some at  home - stairs and walking       11/27/2022    2:04 PM 07/18/2021    3:45 PM 02/18/2020    5:24 PM 09/02/2018    9:52 AM  Depression screen PHQ 2/9  Decreased Interest 0 1 0 0  Down, Depressed, Hopeless 0 0 0 0  PHQ - 2 Score 0 1 0 0  Altered  sleeping 0 1    Tired, decreased energy 1 0    Change in appetite 0 0    Feeling bad or failure about yourself  0 0    Trouble concentrating 0 0    Moving slowly or fidgety/restless 0 0    Suicidal thoughts 0 0    PHQ-9 Score 1 2    Difficult doing work/chores Not difficult at all         11/27/2022    2:04 PM  GAD 7 : Generalized Anxiety Score  Nervous, Anxious, on Edge 1  Control/stop worrying 0  Worry too much - different things 0  Trouble relaxing 0  Restless 0  Easily annoyed or irritable 0  Afraid - awful might happen 0  Total GAD 7 Score 1  Anxiety Difficulty Not difficult at all      Hyperlipidemia  Lab Results  Component Value Date   CHOL 195 11/17/2022   CHOL 203 (H) 09/12/2022   CHOL 230 (H) 07/06/2022   Lab Results  Component Value Date   HDL 72.80 11/17/2022   HDL 77.90 09/12/2022   HDL 86.80 07/06/2022   Lab Results  Component Value Date   LDLCALC 95 11/17/2022   LDLCALC 100 (H) 09/12/2022  LDLCALC 121 (H) 07/06/2022   Lab Results  Component Value Date   TRIG 135.0 11/17/2022   TRIG 125.0 09/12/2022   TRIG 107.0 07/06/2022   Lab Results  Component Value Date   CHOLHDL 3 11/17/2022   CHOLHDL 3 09/12/2022   CHOLHDL 3 07/06/2022   Lab Results  Component Value Date   LDLDIRECT 88.7 10/29/2006   Crestor 10 mg daily   Is vegan now  Eating well  Plant based protein  Doing well   The 10-year ASCVD risk score (Arnett DK, et al., 2019) is: 1.2%   Values used to calculate the score:     Age: 37 years     Sex: Female     Is Non-Hispanic African American: No     Diabetic: No     Tobacco smoker: No     Systolic Blood Pressure: 102 mmHg     Is BP treated: No     HDL Cholesterol: 72.8 mg/dL     Total Cholesterol: 195 mg/dL   Mother was DM and she had heart issues     Glucose Lab Results  Component Value Date   HGBA1C 5.4 11/17/2022   This is down from 5.8  Really good    Lab Results  Component Value Date   WBC 6.2  11/17/2022   HGB 13.4 11/17/2022   HCT 40.7 11/17/2022   MCV 88.8 11/17/2022   PLT 246.0 11/17/2022   Lab Results  Component Value Date   TSH 2.22 11/17/2022   Lab Results  Component Value Date   NA 141 11/17/2022   K 4.2 11/17/2022   CO2 28 11/17/2022   GLUCOSE 100 (H) 11/17/2022   BUN 16 11/17/2022   CREATININE 0.81 11/17/2022   CALCIUM 9.3 11/17/2022   GFR 80.79 11/17/2022   EGFR 67 02/23/2021   Lab Results  Component Value Date   ALT 16 11/17/2022   ALT 16 11/17/2022   AST 16 11/17/2022   AST 16 11/17/2022   ALKPHOS 91 11/17/2022   BILITOT 0.8 11/17/2022       Patient Active Problem List   Diagnosis Date Noted   Colon cancer screening 07/18/2021   Routine general medical examination at a health care facility 02/20/2021   Elevated glucose level 02/18/2020   Symptoms, such as flushing, sleeplessness, headache, lack of concentration, associated with the menopause 02/18/2020   Generalized anxiety disorder 09/02/2018   HYPERCHOLESTEROLEMIA, PURE 10/29/2006   Past Medical History:  Diagnosis Date   Allergy    Anxiety    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    Hyperlipidemia    Past Surgical History:  Procedure Laterality Date   GUM SURGERY  05/29/2010   TONSILLECTOMY     age 64    WISDOM TOOTH EXTRACTION  05/29/1985   Social History   Tobacco Use   Smoking status: Never   Smokeless tobacco: Never  Substance Use Topics   Alcohol use: Yes    Alcohol/week: 5.0 standard drinks of alcohol    Types: 5 Glasses of wine per week    Comment: socially   Drug use: No   Family History  Problem Relation Age of Onset   Colon polyps Mother    Diabetes Mother    Heart disease Mother    Hypertension Mother    Colon cancer Neg Hx    Esophageal cancer Neg Hx    Stomach cancer Neg Hx    Rectal cancer Neg Hx    No Known  Allergies Current Outpatient Medications on File Prior to Visit  Medication Sig Dispense Refill   ASHWAGANDHA PO Take 3,000 mg by mouth in  the morning and at bedtime.     busPIRone (BUSPAR) 30 MG tablet TAKE 1 TABLET BY MOUTH TWICE A DAY 180 tablet 0   Cholecalciferol (VITAMIN D3) 50 MCG (2000 UT) capsule Take 1 capsule by mouth daily.     Glucosamine-Chondroitin 1500-1200 MG/30ML LIQD Take by mouth daily.     Multiple Vitamins-Minerals (COMPLETE WOMENS PO) Take 1 tablet by mouth daily.       Nutritional Supplements (CALCIUM D-GLUCARATE PO) Take 2 capsules by mouth in the morning and at bedtime.     rosuvastatin (CRESTOR) 10 MG tablet Take 1 tablet (10 mg total) by mouth daily. 90 tablet 3   vitamin E 180 MG (400 UNITS) capsule Take 1 capsule by mouth daily.     No current facility-administered medications on file prior to visit.    Review of Systems  Constitutional:  Negative for activity change, appetite change, fatigue, fever and unexpected weight change.  HENT:  Negative for congestion, ear pain, rhinorrhea, sinus pressure and sore throat.   Eyes:  Negative for pain, redness and visual disturbance.  Respiratory:  Negative for cough, shortness of breath and wheezing.   Cardiovascular:  Negative for chest pain and palpitations.  Gastrointestinal:  Negative for abdominal pain, blood in stool, constipation and diarrhea.  Endocrine: Positive for heat intolerance. Negative for polydipsia and polyuria.  Genitourinary:  Negative for dysuria, frequency and urgency.  Musculoskeletal:  Negative for arthralgias, back pain and myalgias.  Skin:  Negative for pallor and rash.  Allergic/Immunologic: Negative for environmental allergies.  Neurological:  Negative for dizziness, syncope and headaches.  Hematological:  Negative for adenopathy. Does not bruise/bleed easily.  Psychiatric/Behavioral:  Positive for sleep disturbance. Negative for decreased concentration and dysphoric mood. The patient is nervous/anxious.        Objective:   Physical Exam Constitutional:      General: She is not in acute distress.    Appearance: Normal  appearance. She is well-developed. She is obese. She is not ill-appearing or diaphoretic.  HENT:     Head: Normocephalic and atraumatic.     Right Ear: Tympanic membrane, ear canal and external ear normal.     Left Ear: Tympanic membrane, ear canal and external ear normal.     Nose: Nose normal. No congestion.     Mouth/Throat:     Mouth: Mucous membranes are moist.     Pharynx: Oropharynx is clear. No posterior oropharyngeal erythema.  Eyes:     General: No scleral icterus.    Extraocular Movements: Extraocular movements intact.     Conjunctiva/sclera: Conjunctivae normal.     Pupils: Pupils are equal, round, and reactive to light.  Neck:     Thyroid: No thyromegaly.     Vascular: No carotid bruit or JVD.  Cardiovascular:     Rate and Rhythm: Normal rate and regular rhythm.     Pulses: Normal pulses.     Heart sounds: Normal heart sounds.     No gallop.  Pulmonary:     Effort: Pulmonary effort is normal. No respiratory distress.     Breath sounds: Normal breath sounds. No wheezing.     Comments: Good air exch Chest:     Chest wall: No tenderness.  Abdominal:     General: Bowel sounds are normal. There is no distension or abdominal bruit.     Palpations:  Abdomen is soft. There is no mass.     Tenderness: There is no abdominal tenderness.     Hernia: No hernia is present.  Genitourinary:    Comments: Breast and pelvic exam are done by gyn provider   Musculoskeletal:        General: No tenderness. Normal range of motion.     Cervical back: Normal range of motion and neck supple. No rigidity. No muscular tenderness.     Right lower leg: No edema.     Left lower leg: No edema.     Comments: No kyphosis   Lymphadenopathy:     Cervical: No cervical adenopathy.  Skin:    General: Skin is warm and dry.     Coloration: Skin is not pale.     Findings: No erythema or rash.     Comments: Few solar lentigines   Normal appearing mole on left back (raised and soft)   Neurological:      Mental Status: She is alert. Mental status is at baseline.     Cranial Nerves: No cranial nerve deficit.     Motor: No abnormal muscle tone.     Coordination: Coordination normal.     Gait: Gait normal.     Deep Tendon Reflexes: Reflexes are normal and symmetric. Reflexes normal.  Psychiatric:        Mood and Affect: Mood normal.        Cognition and Memory: Cognition and memory normal.           Assessment & Plan:   Problem List Items Addressed This Visit       Other   Symptoms, such as flushing, sleeplessness, headache, lack of concentration, associated with the menopause    Taking ashwagandha  Also buspar and sertraline Dealing with hot flushes  Overall doing well        Routine general medical examination at a health care facility - Primary    Reviewed health habits including diet and exercise and skin cancer prevention Reviewed appropriate screening tests for age  Also reviewed health mt list, fam hx and immunization status , as well as social and family history   See HPI Labs reviewed and ordered Under gyn care/ Dr Vincente Poli Has dexa planned next month   (no falls or fractures) encouraged to continue vit D and add strength building exercise  Mammogram 12/2021 -utd Colonoscopy planned 12/06/22  Mood is improved with counseling       HYPERCHOLESTEROLEMIA, PURE    Disc goals for lipids and reasons to control them Rev last labs with pt Rev low sat fat diet in detail LDL of 95 and HDL over 70 Continues crestor 10 g daily and plant based diet        Generalized anxiety disorder    Some improvement with counseling and meditation  Encouraged good health habits  Plan to continue  Sertraline 75 mg dialy  Buspar 30 mg bid   Still having menopausal changes       Relevant Medications   sertraline (ZOLOFT) 50 MG tablet   Elevated glucose level    Lab Results  Component Value Date   HGBA1C 5.4 11/17/2022  Improved disc imp of low glycemic diet and wt  loss to prevent DM2       Colon cancer screening    Overdue for 5 year colonoscopy  Is now scheduled for July 10th

## 2022-11-27 NOTE — Assessment & Plan Note (Signed)
Reviewed health habits including diet and exercise and skin cancer prevention Reviewed appropriate screening tests for age  Also reviewed health mt list, fam hx and immunization status , as well as social and family history   See HPI Labs reviewed and ordered Under gyn care/ Dr Vincente Poli Has dexa planned next month   (no falls or fractures) encouraged to continue vit D and add strength building exercise  Mammogram 12/2021 -utd Colonoscopy planned 12/06/22  Mood is improved with counseling

## 2022-11-27 NOTE — Assessment & Plan Note (Signed)
Disc goals for lipids and reasons to control them Rev last labs with pt Rev low sat fat diet in detail LDL of 95 and HDL over 70 Continues crestor 10 g daily and plant based diet

## 2022-11-27 NOTE — Assessment & Plan Note (Signed)
Lab Results  Component Value Date   HGBA1C 5.4 11/17/2022   Improved disc imp of low glycemic diet and wt loss to prevent DM2

## 2022-11-27 NOTE — Patient Instructions (Addendum)
Add some strength training to your routine, this is important for bone and brain health and can reduce your risk of falls and help your body use insulin properly and regulate weight  Light weights, exercise bands , and internet videos are a good way to start  Yoga (chair or regular), machines , floor exercises or a gym with machines are also good options    If you become interested in a coronary calcium score -here is a handout  Let us know  Average cost is 200$    Keep using sun protection   Keep up the great plant based diet with protein

## 2022-11-27 NOTE — Assessment & Plan Note (Signed)
Overdue for 5 year colonoscopy  Is now scheduled for July 10th

## 2022-11-27 NOTE — Assessment & Plan Note (Signed)
Taking ashwagandha  Also buspar and sertraline Dealing with hot flushes  Overall doing well

## 2022-12-06 ENCOUNTER — Encounter: Payer: Self-pay | Admitting: Gastroenterology

## 2022-12-06 ENCOUNTER — Ambulatory Visit (AMBULATORY_SURGERY_CENTER): Payer: BC Managed Care – PPO | Admitting: Gastroenterology

## 2022-12-06 VITALS — BP 113/52 | HR 60 | Temp 98.1°F | Resp 12 | Ht 64.0 in | Wt 165.0 lb

## 2022-12-06 DIAGNOSIS — Z09 Encounter for follow-up examination after completed treatment for conditions other than malignant neoplasm: Secondary | ICD-10-CM | POA: Diagnosis not present

## 2022-12-06 DIAGNOSIS — D122 Benign neoplasm of ascending colon: Secondary | ICD-10-CM | POA: Diagnosis not present

## 2022-12-06 DIAGNOSIS — Z86010 Personal history of colonic polyps: Secondary | ICD-10-CM | POA: Diagnosis not present

## 2022-12-06 MED ORDER — SODIUM CHLORIDE 0.9 % IV SOLN
500.0000 mL | INTRAVENOUS | Status: DC
Start: 2022-12-06 — End: 2022-12-06

## 2022-12-06 NOTE — Patient Instructions (Signed)
YOU HAD AN ENDOSCOPIC PROCEDURE TODAY AT THE [facility]:   Refer to the procedure report that was given to you for any specific questions about what was found during the examination.  If the procedure report does not answer your questions, please call your gastroenterologist to clarify.  If you requested that your care partner not be given the details of your procedure findings, then the procedure report has been included in a sealed envelope for you to review at your convenience later.  YOU SHOULD EXPECT: Some feelings of bloating in the abdomen. Passage of more gas than usual.  Walking can help get rid of the air that was put into your GI tract during the procedure and reduce the bloating. If you had a lower endoscopy (such as a colonoscopy or flexible sigmoidoscopy) you may notice spotting of blood in your stool or on the toilet paper. If you underwent a bowel prep for your procedure, you may not have a normal bowel movement for a few days.  Please Note:  You might notice some irritation and congestion in your nose or some drainage.  This is from the oxygen used during your procedure.  There is no need for concern and it should clear up in a day or so.  SYMPTOMS TO REPORT IMMEDIATELY:  Following lower endoscopy (colonoscopy or flexible sigmoidoscopy):  Excessive amounts of blood in the stool  Significant tenderness or worsening of abdominal pains  Swelling of the abdomen that is new, acute  Fever of 100F or higher  For urgent or emergent issues, a gastroenterologist can be reached at any hour by calling [phone]. Do not use MyChart messaging for urgent concerns.    DIET:  We do recommend a small meal at first, but then you may proceed to your regular diet.  Drink plenty of fluids but you should avoid alcoholic beverages for 24 hours.  ACTIVITY:  You should plan to take it easy for the rest of today and you should NOT DRIVE or use heavy machinery until tomorrow (because of  the sedation medicines used during the test).    FOLLOW UP: Our staff will call the number listed on your records the next business day following your procedure.  We will call around 7:15- 8:00 am to check on you and address any questions or concerns that you may have regarding the information given to you following your procedure. If we do not reach you, we will leave a message.     If any biopsies were taken you will be contacted by phone or by letter within the next 1-3 weeks.  Please call us at [phone] if you have not heard about the biopsies in 3 weeks.    SIGNATURES/CONFIDENTIALITY: You and/or your care partner have signed paperwork which will be entered into your electronic medical record.  These signatures attest to the fact that that the information above on your After Visit Summary has been reviewed and is understood.  Full responsibility of the confidentiality of this discharge information lies with you and/or your care-partner.  

## 2022-12-06 NOTE — Progress Notes (Signed)
Pt's states no medical or surgical changes since previsit or office visit. 

## 2022-12-06 NOTE — Progress Notes (Signed)
GASTROENTEROLOGY PROCEDURE H&P NOTE   Primary Care Physician: Tower, Audrie Gallus, MD  HPI: Kaitlin Fuller is a 57 y.o. female who presents for colonoscopy for surveillance in setting of previous adenomas.  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     busPIRone (BUSPAR) 30 MG tablet TAKE 1 TABLET BY MOUTH TWICE A DAY 180 tablet 0   Cholecalciferol (VITAMIN D3) 50 MCG (2000 UT) capsule Take 1 capsule by mouth daily.     Glucosamine-Chondroitin 1500-1200 MG/30ML LIQD Take by mouth daily.     Multiple Vitamins-Minerals (COMPLETE WOMENS PO) Take 1 tablet by mouth daily.       Nutritional Supplements (CALCIUM D-GLUCARATE PO) Take 2 capsules by mouth in the morning and at bedtime.     rosuvastatin (CRESTOR) 10 MG tablet Take 1 tablet (10 mg total) by mouth daily. 90 tablet 3   sertraline (ZOLOFT) 50 MG tablet Take 1.5 tablets (75 mg total) by mouth daily. 135 tablet 3   vitamin E 180 MG (400 UNITS) capsule Take 1 capsule by mouth daily.     No current facility-administered medications for this visit.    Current Outpatient Medications:    ASHWAGANDHA PO, Take 3,000 mg by mouth in the morning and at bedtime., Disp: , Rfl:    busPIRone (BUSPAR) 30 MG tablet, TAKE 1 TABLET BY MOUTH TWICE A DAY, Disp: 180 tablet, Rfl: 0   Cholecalciferol (VITAMIN D3) 50 MCG (2000 UT) capsule, Take 1 capsule by mouth daily., Disp: , Rfl:    Glucosamine-Chondroitin 1500-1200 MG/30ML LIQD, Take by mouth daily., Disp: , Rfl:    Multiple Vitamins-Minerals (COMPLETE WOMENS PO), Take 1 tablet by mouth daily.  , Disp: , Rfl:    Nutritional Supplements (CALCIUM  D-GLUCARATE PO), Take 2 capsules by mouth in the morning and at bedtime., Disp: , Rfl:    rosuvastatin (CRESTOR) 10 MG tablet, Take 1 tablet (10 mg total) by mouth daily., Disp: 90 tablet, Rfl: 3   sertraline (ZOLOFT) 50 MG tablet, Take 1.5 tablets (75 mg total) by mouth daily., Disp: 135 tablet, Rfl: 3   vitamin E 180 MG (400 UNITS) capsule, Take 1 capsule by mouth daily., Disp: , Rfl:  No Known Allergies Family History  Problem Relation Age of Onset   Colon polyps Mother    Diabetes Mother    Heart disease Mother    Hypertension Mother    Colon cancer Neg Hx    Esophageal cancer Neg Hx    Stomach cancer Neg Hx    Rectal cancer Neg Hx    Social History   Socioeconomic History   Marital status: Married    Spouse name: Not on file   Number of children: Not on file   Years of education: Not on file   Highest education level: Not on file  Occupational History   Not on file  Tobacco Use   Smoking status: Never   Smokeless tobacco: Never  Substance and Sexual Activity   Alcohol use: Yes    Alcohol/week: 5.0 standard  drinks of alcohol    Types: 5 Glasses of wine per week    Comment: socially   Drug use: No   Sexual activity: Not Currently  Other Topics Concern   Not on file  Social History Narrative   Husband is a Administrator, arts, 2nd marriage   Exercises regular including yoga   Social Determinants of Health   Financial Resource Strain: Not on file  Food Insecurity: Not on file  Transportation Needs: Not on file  Physical Activity: Not on file  Stress: Not on file  Social Connections: Not on file  Intimate Partner Violence: Not on file    Physical Exam: There were no vitals filed for this visit. There is no height or weight on file to calculate BMI. GEN: NAD EYE: Sclerae anicteric ENT: MMM CV: Non-tachycardic GI: Soft, NT/ND NEURO:  Alert & Oriented x 3  Lab Results: No results for input(s): "WBC", "HGB", "HCT", "PLT" in the last 72 hours. BMET No results for  input(s): "NA", "K", "CL", "CO2", "GLUCOSE", "BUN", "CREATININE", "CALCIUM" in the last 72 hours. LFT No results for input(s): "PROT", "ALBUMIN", "AST", "ALT", "ALKPHOS", "BILITOT", "BILIDIR", "IBILI" in the last 72 hours. PT/INR No results for input(s): "LABPROT", "INR" in the last 72 hours.   Impression / Plan: This is a 57 y.o.female who presents for colonoscopy for surveillance in setting of previous adenomas.  The risks and benefits of endoscopic evaluation/treatment were discussed with the patient and/or family; these include but are not limited to the risk of perforation, infection, bleeding, missed lesions, lack of diagnosis, severe illness requiring hospitalization, as well as anesthesia and sedation related illnesses.  The patient's history has been reviewed, patient examined, no change in status, and deemed stable for procedure.  The patient and/or family is agreeable to proceed.    Corliss Parish, MD [facility] Advanced Endoscopy Office # [phone]

## 2022-12-06 NOTE — Progress Notes (Signed)
Called to room to assist during endoscopic procedure.  Patient ID and intended procedure confirmed with present staff. Received instructions for my participation in the procedure from the performing physician.  

## 2022-12-06 NOTE — Op Note (Signed)
[facility] Patient Name: Kaitlin Fuller Procedure Date: 12/06/2022 2:17 PM MRN: [id number] Endoscopist: Corliss Parish , MD, [id number] Age: 57 Referring MD:  Date of Birth: 1966/02/04 Gender: Female Account #: [id number] Procedure:                Colonoscopy Indications:              Surveillance: Personal history of adenomatous                            polyps on last colonoscopy > 5 years ago Medicines:                Monitored Anesthesia Care Procedure:                Pre-Anesthesia Assessment:                           - Prior to the procedure, a History and Physical                            was performed, and patient medications and                            allergies were reviewed. The patient's tolerance of                            previous anesthesia was also reviewed. The risks                            and benefits of the procedure and the sedation                            options and risks were discussed with the patient.                            All questions were answered, and informed consent                            was obtained. Prior Anticoagulants: The patient has                            taken no anticoagulant or antiplatelet agents. ASA                            Grade Assessment: I - A normal, healthy patient.                            After reviewing the risks and benefits, the patient                            was deemed in satisfactory condition to undergo the                            procedure.  After obtaining informed consent, the colonoscope                            was passed under direct vision. Throughout the                            procedure, the patient's blood pressure, pulse, and                            oxygen saturations were monitored continuously. The                            CF [id number] #[id number] was introduced through the anus                            and advanced to the 3 cm into  the ileum. The                            colonoscopy was performed without difficulty. The                            patient tolerated the procedure. The quality of the                            bowel preparation was good. The terminal ileum,                            ileocecal valve, appendiceal orifice, and rectum                            were photographed. Scope In: 2:21:26 PM Scope Out: 2:34:04 PM Scope Withdrawal Time: 0 hours 8 minutes 59 seconds  Total Procedure Duration: 0 hours 12 minutes 38 seconds  Findings:                 The digital rectal exam findings include                            hemorrhoids. Pertinent negatives include no                            palpable rectal lesions.                           The terminal ileum and ileocecal valve appeared                            normal.                           A 6 mm polyp was found in the ascending colon. The                            polyp was sessile. The polyp was removed with a  cold snare. Resection and retrieval were complete.                           Multiple small-mouthed diverticula were found in                            the recto-sigmoid colon and sigmoid colon.                           Normal mucosa was found in the entire colon                            otherwise.                           Non-bleeding non-thrombosed internal hemorrhoids                            were found during retroflexion, during perianal                            exam and during digital exam. The hemorrhoids were                            Grade II (internal hemorrhoids that prolapse but                            reduce spontaneously). Complications:            No immediate complications. Estimated Blood Loss:     Estimated blood loss was minimal. Impression:               - Hemorrhoids found on digital rectal exam.                           - The examined portion of the ileum was normal.                            - One 6 mm polyp in the ascending colon, removed                            with a cold snare. Resected and retrieved.                           - Diverticulosis in the recto-sigmoid colon and in                            the sigmoid colon.                           - Normal mucosa in the entire examined colon                            otherwise.                           -  Non-bleeding non-thrombosed internal hemorrhoids. Recommendation:           - The patient will be observed post-procedure,                            until all discharge criteria are met.                           - Discharge patient to home.                           - Patient has a contact number available for                            emergencies. The signs and symptoms of potential                            delayed complications were discussed with the                            patient. Return to normal activities tomorrow.                            Written discharge instructions were provided to the                            patient.                           - High fiber diet.                           - Use FiberCon 1-2 tablets PO daily.                           - Continue present medications.                           - Await pathology results.                           - Repeat colonoscopy in 5 to 7 years for                            surveillance based on pathology results.                           - The findings and recommendations were discussed                            with the patient.                           - The findings and recommendations were discussed                            with the patient's family. Vicente Serene  Mansouraty, MD 12/06/2022 2:38:32 PM

## 2022-12-06 NOTE — Progress Notes (Signed)
Sedate, gd SR, tolerated procedure well, VSS, report to RN 

## 2022-12-07 ENCOUNTER — Telehealth: Payer: Self-pay | Admitting: *Deleted

## 2022-12-07 NOTE — Telephone Encounter (Signed)
  Follow up Call-     12/06/2022    1:27 PM  Call back number  Post procedure Call Back phone  # [phone]  Permission to leave phone message Yes    Post procedure follow up phone call. No answer at number given.  Left message on voicemail.

## 2022-12-13 ENCOUNTER — Encounter: Payer: Self-pay | Admitting: Gastroenterology

## 2023-01-24 ENCOUNTER — Other Ambulatory Visit: Payer: Self-pay | Admitting: Family Medicine

## 2023-03-08 LAB — HM MAMMOGRAPHY

## 2023-03-08 LAB — HM DEXA SCAN

## 2023-03-09 LAB — HM PAP SMEAR

## 2023-05-18 ENCOUNTER — Encounter: Payer: Self-pay | Admitting: Family Medicine

## 2023-05-18 NOTE — Telephone Encounter (Signed)
Have we started back with this yet?

## 2023-06-13 ENCOUNTER — Other Ambulatory Visit: Payer: Self-pay | Admitting: Medical Genetics

## 2023-06-15 ENCOUNTER — Other Ambulatory Visit: Payer: 59

## 2023-06-18 ENCOUNTER — Other Ambulatory Visit: Payer: 59

## 2023-06-23 ENCOUNTER — Other Ambulatory Visit: Payer: Self-pay

## 2023-06-25 ENCOUNTER — Other Ambulatory Visit
Admission: RE | Admit: 2023-06-25 | Discharge: 2023-06-25 | Disposition: A | Discharge status: Home / Self Care | Payer: Self-pay | Source: Ambulatory Visit | Attending: Medical Genetics | Admitting: Medical Genetics

## 2023-07-08 LAB — GENECONNECT MOLECULAR SCREEN: Genetic Analysis Overall Interpretation: NEGATIVE

## 2023-09-11 ENCOUNTER — Other Ambulatory Visit: Payer: Self-pay | Admitting: Family Medicine

## 2023-10-28 ENCOUNTER — Other Ambulatory Visit: Payer: Self-pay | Admitting: Family Medicine

## 2023-10-29 NOTE — Telephone Encounter (Signed)
 Pt is due for CPE (labs prior) on or after 11/28/23, please schedule and then route back to me

## 2023-10-30 NOTE — Telephone Encounter (Signed)
 Lvmtcb. Sent mychart message

## 2023-10-31 NOTE — Telephone Encounter (Signed)
 Spoke to pt, scheduled cpe for 12/25/23

## 2023-11-20 ENCOUNTER — Encounter: Admitting: Family Medicine

## 2023-12-12 ENCOUNTER — Other Ambulatory Visit: Payer: Self-pay | Admitting: Family Medicine

## 2023-12-16 ENCOUNTER — Telehealth: Payer: Self-pay | Admitting: Family Medicine

## 2023-12-16 DIAGNOSIS — R7309 Other abnormal glucose: Secondary | ICD-10-CM

## 2023-12-16 DIAGNOSIS — E7800 Pure hypercholesterolemia, unspecified: Secondary | ICD-10-CM

## 2023-12-16 DIAGNOSIS — Z0000 Encounter for general adult medical examination without abnormal findings: Secondary | ICD-10-CM

## 2023-12-16 NOTE — Telephone Encounter (Signed)
-----   Message from Veva JINNY Ferrari sent at 11/27/2023  3:47 PM EDT ----- Regarding: Lab orders for Tue, 7.23.25 Patient is scheduled for CPX labs, please order future labs, Thanks , Veva

## 2023-12-18 ENCOUNTER — Other Ambulatory Visit

## 2023-12-19 ENCOUNTER — Ambulatory Visit: Payer: Self-pay | Admitting: Family Medicine

## 2023-12-19 ENCOUNTER — Other Ambulatory Visit (INDEPENDENT_AMBULATORY_CARE_PROVIDER_SITE_OTHER)

## 2023-12-19 DIAGNOSIS — R7309 Other abnormal glucose: Secondary | ICD-10-CM

## 2023-12-19 DIAGNOSIS — E7800 Pure hypercholesterolemia, unspecified: Secondary | ICD-10-CM

## 2023-12-19 DIAGNOSIS — Z0000 Encounter for general adult medical examination without abnormal findings: Secondary | ICD-10-CM | POA: Diagnosis not present

## 2023-12-19 LAB — COMPREHENSIVE METABOLIC PANEL WITH GFR
ALT: 23 U/L (ref 0–35)
AST: 24 U/L (ref 0–37)
Albumin: 4.9 g/dL (ref 3.5–5.2)
Alkaline Phosphatase: 91 U/L (ref 39–117)
BUN: 12 mg/dL (ref 6–23)
CO2: 28 meq/L (ref 19–32)
Calcium: 9.6 mg/dL (ref 8.4–10.5)
Chloride: 102 meq/L (ref 96–112)
Creatinine, Ser: 0.76 mg/dL (ref 0.40–1.20)
GFR: 86.55 mL/min (ref >60.00)
Glucose, Bld: 95 mg/dL (ref 70–99)
Potassium: 4.5 meq/L (ref 3.5–5.1)
Sodium: 139 meq/L (ref 135–145)
Total Bilirubin: 0.6 mg/dL (ref 0.2–1.2)
Total Protein: 7.5 g/dL (ref 6.0–8.3)

## 2023-12-19 LAB — TSH: TSH: 4.83 u[IU]/mL (ref 0.35–5.50)

## 2023-12-19 LAB — LIPID PANEL
Cholesterol: 225 mg/dL — ABNORMAL HIGH (ref 0–200)
HDL: 92.2 mg/dL (ref >39.00)
LDL Cholesterol: 90 mg/dL (ref 0–99)
NonHDL: 132.78
Total CHOL/HDL Ratio: 2
Triglycerides: 215 mg/dL — ABNORMAL HIGH (ref 0.0–149.0)
VLDL: 43 mg/dL — ABNORMAL HIGH (ref 0.0–40.0)

## 2023-12-19 LAB — CBC WITH DIFFERENTIAL/PLATELET
Basophils Absolute: 0 K/uL (ref 0.0–0.1)
Basophils Relative: 0.7 % (ref 0.0–3.0)
Eosinophils Absolute: 0.1 K/uL (ref 0.0–0.7)
Eosinophils Relative: 1.6 % (ref 0.0–5.0)
HCT: 40.8 % (ref 36.0–46.0)
Hemoglobin: 13.5 g/dL (ref 12.0–15.0)
Lymphocytes Relative: 28.8 % (ref 12.0–46.0)
Lymphs Abs: 1.7 K/uL (ref 0.7–4.0)
MCHC: 33.2 g/dL (ref 30.0–36.0)
MCV: 86.7 fl (ref 78.0–100.0)
Monocytes Absolute: 0.5 K/uL (ref 0.1–1.0)
Monocytes Relative: 8.5 % (ref 3.0–12.0)
Neutro Abs: 3.6 K/uL (ref 1.4–7.7)
Neutrophils Relative %: 60.4 % (ref 43.0–77.0)
Platelets: 228 K/uL (ref 150.0–400.0)
RBC: 4.71 Mil/uL (ref 3.87–5.11)
RDW: 13.9 % (ref 11.5–15.5)
WBC: 5.9 K/uL (ref 4.0–10.5)

## 2023-12-19 LAB — HEMOGLOBIN A1C: Hgb A1c MFr Bld: 5.9 % (ref 4.6–6.5)

## 2023-12-25 ENCOUNTER — Encounter: Payer: Self-pay | Admitting: Family Medicine

## 2023-12-25 ENCOUNTER — Ambulatory Visit (INDEPENDENT_AMBULATORY_CARE_PROVIDER_SITE_OTHER): Admitting: Family Medicine

## 2023-12-25 VITALS — BP 116/78 | HR 79 | Temp 98.5°F | Ht 63.6 in | Wt 186.0 lb

## 2023-12-25 DIAGNOSIS — N951 Menopausal and female climacteric states: Secondary | ICD-10-CM

## 2023-12-25 DIAGNOSIS — Z0000 Encounter for general adult medical examination without abnormal findings: Secondary | ICD-10-CM | POA: Diagnosis not present

## 2023-12-25 DIAGNOSIS — F411 Generalized anxiety disorder: Secondary | ICD-10-CM

## 2023-12-25 DIAGNOSIS — Z1211 Encounter for screening for malignant neoplasm of colon: Secondary | ICD-10-CM | POA: Diagnosis not present

## 2023-12-25 DIAGNOSIS — E7800 Pure hypercholesterolemia, unspecified: Secondary | ICD-10-CM | POA: Diagnosis not present

## 2023-12-25 DIAGNOSIS — R7303 Prediabetes: Secondary | ICD-10-CM

## 2023-12-25 NOTE — Assessment & Plan Note (Signed)
 Lab Results  Component Value Date   HGBA1C 5.9 12/19/2023   HGBA1C 5.4 11/17/2022   HGBA1C 5.8 07/12/2021   In prediabetic range disc imp of low glycemic diet and wt loss to prevent DM2

## 2023-12-25 NOTE — Assessment & Plan Note (Signed)
 Colonoscopy utd from 11/2022

## 2023-12-25 NOTE — Assessment & Plan Note (Signed)
 Overall doing better with menopausal symptoms

## 2023-12-25 NOTE — Assessment & Plan Note (Signed)
 Doing better  Through the worst menopausal symptoms  Continues  Buspar  30 mg bid Sertraline  75 mg daily

## 2023-12-25 NOTE — Patient Instructions (Addendum)
 Keep up the great exercise  Add some strength training to your routine, this is important for bone and brain health and can reduce your risk of falls and help your body use insulin properly and regulate weight  Light weights, exercise bands , and internet videos are a good way to start  Yoga (chair or regular), machines , floor exercises or a gym with machines are also good options     To prevent diabetes Try to get most of your carbohydrates from produce (with the exception of white potatoes) and whole grains Eat less bread/pasta/rice/snack foods/cereals/sweets and other items from the middle of the grocery store (processed carbs)

## 2023-12-25 NOTE — Assessment & Plan Note (Signed)
 Disc goals for lipids and reasons to control them Rev last labs with pt Rev low sat fat diet in detail Overall fairly stable  High HDL  LDL 90 and trig up a bit to 215 Encouraged to watch sugar as well as fat in diet  Continues crestor  10 mg daily

## 2023-12-25 NOTE — Assessment & Plan Note (Signed)
 Reviewed health habits including diet and exercise and skin cancer prevention Reviewed appropriate screening tests for age  Also reviewed health mt list, fam hx and immunization status , as well as social and family history   See HPI Labs reviewed and ordered Health Maintenance  Topic Date Due   Hepatitis B Vaccine (1 of 3 - 19+ 3-dose series) Never done   Mammogram  11/16/2023   Hepatitis C Screening  11/26/2032*   HIV Screening  11/26/2032*   Flu Shot  12/28/2023   Pap with HPV screening  11/16/2024   Colon Cancer Screening  12/06/2027   DTaP/Tdap/Td vaccine (3 - Td or Tdap) 08/31/2031   COVID-19 Vaccine  Completed   Zoster (Shingles) Vaccine  Completed   HPV Vaccine  Aged Out   Meningitis B Vaccine  Aged Out  *Topic was postponed. The date shown is not the original due date.    Sent to gyn for pap, dexa and mammo reports, per pt is up to date Discussed fall prevention, supplements and exercise for bone density  Is good about sun protection  No falls or fractures PHQ improved at 1  Labs reviewed

## 2023-12-25 NOTE — Progress Notes (Signed)
 Subjective:    Patient ID: Kaitlin Fuller, female    DOB: November 18, 1965, 58 y.o.   MRN: [id number]  HPI  Here for health maintenance exam and to review chronic medical problems   Wt Readings from Last 3 Encounters:  12/25/23 186 lb (84.4 kg)  12/06/22 165 lb (74.8 kg)  11/27/22 173 lb 8 oz (78.7 kg)   32.33 kg/m  Vitals:   12/25/23 1517  BP: 116/78  Pulse: 79  Temp: 98.5 F (36.9 C)  SpO2: 98%    Immunization History  Administered Date(s) Administered   Influenza, Seasonal, Injecte, Preservative Fre 03/08/2023   Influenza,inj,Quad PF,6+ Mos 02/28/2018, 01/28/2019, 03/24/2020, 03/15/2021, 03/10/2022   Moderna Sars-Covid-2 Vaccination 08/28/2019, 09/30/2019   PFIZER Comirnaty(Gray Top)Covid-19 Tri-Sucrose Vaccine 09/24/2020   PFIZER(Purple Top)SARS-COV-2 Vaccination 04/23/2020   Pfizer Covid-19 Vaccine Bivalent Booster 58yrs & up 03/15/2021   Pfizer(Comirnaty)Fall Seasonal Vaccine 12 years and older 03/08/2023   Td 07/18/2021   Tdap 08/30/2021   Varicella 07/09/2020   Zoster Recombinant(Shingrix) 07/09/2020, 10/04/2020    Health Maintenance Due  Topic Date Due   Hepatitis B Vaccines (1 of 3 - 19+ 3-dose series) Never done   MAMMOGRAM  11/16/2023   Doing well  Fostering a pregnant cat and babies     Mammogram 02/2023  Self breast exam-no lumps   Gyn health Pap 10/2021  Sees gyn Dr Mat   Post menopausal     Colon cancer screening -colonoscopy 11/2022   Bone health  Dexa -had 02/2023 -osteopenia   Falls-none  Fractures-none  Supplements  ca and D  Exercise  Walking Weight bearing activities  Hand weights  Yoga   Does not see dermatology  Avoids sun    Mood    12/25/2023    3:23 PM 11/27/2022    2:04 PM 07/18/2021    3:45 PM 02/18/2020    5:24 PM 09/02/2018    9:52 AM  Depression screen PHQ 2/9  Decreased Interest 0 0 1 0 0  Down, Depressed, Hopeless 0 0 0 0 0  PHQ - 2 Score 0 0 1 0 0  Altered sleeping 0 0 1    Tired, decreased energy 0 1  0    Change in appetite 0 0 0    Feeling bad or failure about yourself  0 0 0    Trouble concentrating 0 0 0    Moving slowly or fidgety/restless 0 0 0    Suicidal thoughts 0 0 0    PHQ-9 Score 0 1 2    Difficult doing work/chores Not difficult at all Not difficult at all         12/25/2023    3:23 PM 11/27/2022    2:04 PM  GAD 7 : Generalized Anxiety Score  Nervous, Anxious, on Edge 0 1  Control/stop worrying 0 0  Worry too much - different things 0 0  Trouble relaxing 0 0  Restless 0 0  Easily annoyed or irritable 0 0  Afraid - awful might happen 0 0  Total GAD 7 Score 0 1  Anxiety Difficulty Not difficult at all Not difficult at all   Fine now / better  Comfortable in her own skin    History of GAD Buspar  30 mg bid Sertraline  75 mg daily   Hyperlipidemia  Lab Results  Component Value Date   CHOL 225 (H) 12/19/2023   CHOL 195 11/17/2022   CHOL 203 (H) 09/12/2022   Lab Results  Component Value Date  HDL 92.20 12/19/2023   HDL 72.80 11/17/2022   HDL 77.90 09/12/2022   Lab Results  Component Value Date   LDLCALC 90 12/19/2023   LDLCALC 95 11/17/2022   LDLCALC 100 (H) 09/12/2022   Lab Results  Component Value Date   TRIG 215.0 (H) 12/19/2023   TRIG 135.0 11/17/2022   TRIG 125.0 09/12/2022   Lab Results  Component Value Date   CHOLHDL 2 12/19/2023   CHOLHDL 3 11/17/2022   CHOLHDL 3 09/12/2022   Lab Results  Component Value Date   LDLDIRECT 88.7 10/29/2006   Crestor  10 mg daily  Well controlled   Started the hello fresh meal program recently  Really likes it  Mostly plant based proteins   Avoids beef for the most part    Glucose Lab Results  Component Value Date   HGBA1C 5.9 12/19/2023   HGBA1C 5.4 11/17/2022   HGBA1C 5.8 07/12/2021   Mother has DM2   Other labs Lab Results  Component Value Date   WBC 5.9 12/19/2023   HGB 13.5 12/19/2023   HCT 40.8 12/19/2023   MCV 86.7 12/19/2023   PLT 228.0 12/19/2023   Lab Results   Component Value Date   TSH 4.83 12/19/2023   Lab Results  Component Value Date   NA 139 12/19/2023   K 4.5 12/19/2023   CO2 28 12/19/2023   GLUCOSE 95 12/19/2023   BUN 12 12/19/2023   CREATININE 0.76 12/19/2023   CALCIUM  9.6 12/19/2023   GFR 86.55 12/19/2023   EGFR 67 02/23/2021   Lab Results  Component Value Date   ALT 23 12/19/2023   AST 24 12/19/2023   ALKPHOS 91 12/19/2023   BILITOT 0.6 12/19/2023     Patient Active Problem List   Diagnosis Date Noted   Colon cancer screening 07/18/2021   Routine general medical examination at a health care facility 02/20/2021   Prediabetes 02/18/2020   Symptoms, such as flushing, sleeplessness, headache, lack of concentration, associated with the menopause 02/18/2020   Generalized anxiety disorder 09/02/2018   HYPERCHOLESTEROLEMIA, PURE 10/29/2006   Past Medical History:  Diagnosis Date   Allergy    Anxiety    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    Hyperlipidemia    Past Surgical History:  Procedure Laterality Date   GUM SURGERY  05/29/2010   TONSILLECTOMY     age 65    WISDOM TOOTH EXTRACTION  05/29/1985   Social History   Tobacco Use   Smoking status: Never   Smokeless tobacco: Never  Substance Use Topics   Alcohol use: Yes    Alcohol/week: 5.0 standard drinks of alcohol    Types: 5 Glasses of wine per week    Comment: socially   Drug use: No   Family History  Problem Relation Age of Onset   Colon polyps Mother    Diabetes Mother    Heart disease Mother    Hypertension Mother    Colon cancer Neg Hx    Esophageal cancer Neg Hx    Stomach cancer Neg Hx    Rectal cancer Neg Hx    No Known Allergies Current Outpatient Medications on File Prior to Visit  Medication Sig Dispense Refill   ASHWAGANDHA PO Take 3,000 mg by mouth in the morning and at bedtime.     busPIRone  (BUSPAR ) 30 MG tablet TAKE 1 TABLET BY MOUTH TWICE A DAY 180 tablet 0   Cholecalciferol (VITAMIN D3) 50 MCG (2000 UT) capsule Take 1  capsule by mouth daily.     Glucosamine-Chondroitin 1500-1200 MG/30ML LIQD Take by mouth daily.     Multiple Vitamins-Minerals (COMPLETE WOMENS PO) Take 1 tablet by mouth daily.       Nutritional Supplements (CALCIUM  D-GLUCARATE PO) Take 2 capsules by mouth in the morning and at bedtime.     rosuvastatin  (CRESTOR ) 10 MG tablet TAKE 1 TABLET BY MOUTH EVERY DAY 90 tablet 0   sertraline  (ZOLOFT ) 50 MG tablet Take 1.5 tablets (75 mg total) by mouth daily. 135 tablet 3   vitamin E 180 MG (400 UNITS) capsule Take 1 capsule by mouth daily.     No current facility-administered medications on file prior to visit.    Review of Systems  Constitutional:  Negative for activity change, appetite change, fatigue, fever and unexpected weight change.  HENT:  Negative for congestion, ear pain, rhinorrhea, sinus pressure and sore throat.   Eyes:  Negative for pain, redness and visual disturbance.  Respiratory:  Negative for cough, shortness of breath and wheezing.   Cardiovascular:  Negative for chest pain and palpitations.  Gastrointestinal:  Negative for abdominal pain, blood in stool, constipation and diarrhea.  Endocrine: Negative for polydipsia and polyuria.  Genitourinary:  Negative for dysuria, frequency and urgency.  Musculoskeletal:  Negative for arthralgias, back pain and myalgias.  Skin:  Negative for pallor and rash.  Allergic/Immunologic: Negative for environmental allergies.  Neurological:  Negative for dizziness, syncope and headaches.  Hematological:  Negative for adenopathy. Does not bruise/bleed easily.  Psychiatric/Behavioral:  Negative for decreased concentration and dysphoric mood. The patient is not nervous/anxious.        Objective:   Physical Exam Constitutional:      General: She is not in acute distress.    Appearance: Normal appearance. She is well-developed. She is obese. She is not ill-appearing or diaphoretic.  HENT:     Head: Normocephalic and atraumatic.     Right Ear:  Tympanic membrane, ear canal and external ear normal.     Left Ear: Tympanic membrane, ear canal and external ear normal.     Nose: Nose normal. No congestion.     Mouth/Throat:     Mouth: Mucous membranes are moist.     Pharynx: Oropharynx is clear. No posterior oropharyngeal erythema.  Eyes:     General: No scleral icterus.    Extraocular Movements: Extraocular movements intact.     Conjunctiva/sclera: Conjunctivae normal.     Pupils: Pupils are equal, round, and reactive to light.  Neck:     Thyroid: No thyromegaly.     Vascular: No carotid bruit or JVD.  Cardiovascular:     Rate and Rhythm: Normal rate and regular rhythm.     Pulses: Normal pulses.     Heart sounds: Normal heart sounds.     No gallop.  Pulmonary:     Effort: Pulmonary effort is normal. No respiratory distress.     Breath sounds: Normal breath sounds. No wheezing.     Comments: Good air exch Chest:     Chest wall: No tenderness.  Abdominal:     General: Bowel sounds are normal. There is no distension or abdominal bruit.     Palpations: Abdomen is soft. There is no mass.     Tenderness: There is no abdominal tenderness.     Hernia: No hernia is present.  Genitourinary:    Comments: Breast and pelvic exam are done by gyn provider   Musculoskeletal:        General:  No tenderness. Normal range of motion.     Cervical back: Normal range of motion and neck supple. No rigidity. No muscular tenderness.     Right lower leg: No edema.     Left lower leg: No edema.     Comments: No kyphosis   Lymphadenopathy:     Cervical: No cervical adenopathy.  Skin:    General: Skin is warm and dry.     Coloration: Skin is not pale.     Findings: No erythema or rash.     Comments: Fair  Solar lentigines diffusely   Some insect bites/scabs on ankles   Neurological:     Mental Status: She is alert. Mental status is at baseline.     Cranial Nerves: No cranial nerve deficit.     Motor: No abnormal muscle tone.      Coordination: Coordination normal.     Gait: Gait normal.     Deep Tendon Reflexes: Reflexes are normal and symmetric. Reflexes normal.  Psychiatric:        Mood and Affect: Mood normal.        Cognition and Memory: Cognition and memory normal.           Assessment & Plan:   Problem List Items Addressed This Visit       Other   Symptoms, such as flushing, sleeplessness, headache, lack of concentration, associated with the menopause   Overall doing better with menopausal symptoms        Routine general medical examination at a health care facility - Primary   Reviewed health habits including diet and exercise and skin cancer prevention Reviewed appropriate screening tests for age  Also reviewed health mt list, fam hx and immunization status , as well as social and family history   See HPI Labs reviewed and ordered Health Maintenance  Topic Date Due   Hepatitis B Vaccine (1 of 3 - 19+ 3-dose series) Never done   Mammogram  11/16/2023   Hepatitis C Screening  11/26/2032*   HIV Screening  11/26/2032*   Flu Shot  12/28/2023   Pap with HPV screening  11/16/2024   Colon Cancer Screening  12/06/2027   DTaP/Tdap/Td vaccine (3 - Td or Tdap) 08/31/2031   COVID-19 Vaccine  Completed   Zoster (Shingles) Vaccine  Completed   HPV Vaccine  Aged Out   Meningitis B Vaccine  Aged Out  *Topic was postponed. The date shown is not the original due date.    Sent to gyn for pap, dexa and mammo reports, per pt is up to date Discussed fall prevention, supplements and exercise for bone density  Is good about sun protection  No falls or fractures PHQ improved at 1  Labs reviewed       Prediabetes   Lab Results  Component Value Date   HGBA1C 5.9 12/19/2023   HGBA1C 5.4 11/17/2022   HGBA1C 5.8 07/12/2021   In prediabetic range disc imp of low glycemic diet and wt loss to prevent DM2       HYPERCHOLESTEROLEMIA, PURE   Disc goals for lipids and reasons to control them Rev last  labs with pt Rev low sat fat diet in detail Overall fairly stable  High HDL  LDL 90 and trig up a bit to 215 Encouraged to watch sugar as well as fat in diet  Continues crestor  10 mg daily       Generalized anxiety disorder   Doing better  Through the worst menopausal symptoms  Continues  Buspar  30 mg bid Sertraline  75 mg daily       Colon cancer screening   Colonoscopy utd from 11/2022

## 2023-12-26 ENCOUNTER — Other Ambulatory Visit: Payer: Self-pay | Admitting: Family Medicine

## 2024-02-02 ENCOUNTER — Other Ambulatory Visit: Payer: Self-pay | Admitting: Family Medicine

## 2024-03-09 ENCOUNTER — Other Ambulatory Visit: Payer: Self-pay | Admitting: Family Medicine

## 2024-06-01 ENCOUNTER — Other Ambulatory Visit: Payer: Self-pay | Admitting: Family Medicine

## 2024-06-01 MED ORDER — TEMAZEPAM 7.5 MG PO CAPS: 7.5000 mg | ORAL_CAPSULE | Freq: Every evening | ORAL | 0 refills | Status: AC | PRN

## 2024-06-01 NOTE — Progress Notes (Unsigned)
 Dr. Randeen-   This patient's husband died suddenly from a dissection.   I was his PCP. I called her to give my condolences.  I was always glad to see him.    She has had sig insomnia in the meantime.  I sent short rx for temazepam  with routine cautions.  Please follow up with patient about her anxiety and insomnia.  Thanks.

## 2024-06-01 NOTE — Progress Notes (Signed)
 Thanks so much for that-sorry to hear it  We will touch base

## 2024-06-02 ENCOUNTER — Telehealth: Payer: Self-pay

## 2024-06-02 NOTE — Progress Notes (Signed)
 Please check on patient later this week to see how she is holding up Thanks

## 2024-06-02 NOTE — Telephone Encounter (Signed)
 Please see 06/01/24 note from Dr Cleatus; sending to Dr Randeen.

## 2024-06-02 NOTE — Telephone Encounter (Signed)
 SABRA

## 2024-06-06 NOTE — Progress Notes (Unsigned)
 Left VM requesting pt to call the office back

## 2024-06-16 ENCOUNTER — Encounter: Payer: Self-pay | Admitting: Family Medicine

## 2024-06-16 MED ORDER — SERTRALINE HCL 100 MG PO TABS: 100.0000 mg | ORAL_TABLET | Freq: Every day | ORAL | 0 refills | Status: AC

## 2024-06-16 NOTE — Progress Notes (Unsigned)
 Pt sent a mychart message regarding this please see mychart message

## 2024-06-30 ENCOUNTER — Encounter: Payer: Self-pay | Admitting: Family Medicine

## 2024-06-30 DIAGNOSIS — R7303 Prediabetes: Secondary | ICD-10-CM

## 2024-06-30 DIAGNOSIS — E7800 Pure hypercholesterolemia, unspecified: Secondary | ICD-10-CM

## 2024-07-03 ENCOUNTER — Other Ambulatory Visit

## 2024-07-07 ENCOUNTER — Ambulatory Visit: Admitting: Family Medicine

## 2024-07-10 ENCOUNTER — Other Ambulatory Visit

## 2024-07-14 ENCOUNTER — Ambulatory Visit: Admitting: Family Medicine
